# Patient Record
Sex: Male | Born: 1950 | Race: White | Hispanic: No | Marital: Married | Smoking: Never smoker
Health system: Southern US, Community
[De-identification: ages and names within clinical notes are randomized; demographics above are authoritative.]

## PROBLEM LIST (undated history)

## (undated) DIAGNOSIS — I10 Essential (primary) hypertension: Secondary | ICD-10-CM

## (undated) DIAGNOSIS — I219 Acute myocardial infarction, unspecified: Secondary | ICD-10-CM

## (undated) DIAGNOSIS — Z87442 Personal history of urinary calculi: Secondary | ICD-10-CM

## (undated) DIAGNOSIS — I2510 Atherosclerotic heart disease of native coronary artery without angina pectoris: Secondary | ICD-10-CM

## (undated) DIAGNOSIS — E785 Hyperlipidemia, unspecified: Secondary | ICD-10-CM

## (undated) HISTORY — PX: CARDIAC CATHETERIZATION: SHX172

## (undated) HISTORY — DX: Acute myocardial infarction, unspecified: I21.9

## (undated) HISTORY — DX: Essential (primary) hypertension: I10

## (undated) HISTORY — DX: Hyperlipidemia, unspecified: E78.5

## (undated) HISTORY — DX: Atherosclerotic heart disease of native coronary artery without angina pectoris: I25.10

---

## 2001-11-15 ENCOUNTER — Inpatient Hospital Stay (HOSPITAL_COMMUNITY)
Admission: EM | Admit: 2001-11-15 | Discharge: 2001-11-20 | Payer: Self-pay | Admitting: Thoracic Surgery (Cardiothoracic Vascular Surgery)

## 2001-11-16 ENCOUNTER — Encounter: Payer: Self-pay | Admitting: Thoracic Surgery (Cardiothoracic Vascular Surgery)

## 2001-11-17 ENCOUNTER — Encounter: Payer: Self-pay | Admitting: Thoracic Surgery (Cardiothoracic Vascular Surgery)

## 2001-11-18 ENCOUNTER — Encounter: Payer: Self-pay | Admitting: Thoracic Surgery (Cardiothoracic Vascular Surgery)

## 2002-07-21 HISTORY — PX: CORONARY ARTERY BYPASS GRAFT: SHX141

## 2005-01-08 ENCOUNTER — Ambulatory Visit: Payer: Self-pay | Admitting: Cardiology

## 2010-02-13 ENCOUNTER — Ambulatory Visit: Payer: Self-pay | Admitting: Internal Medicine

## 2010-04-04 ENCOUNTER — Ambulatory Visit: Payer: Self-pay | Admitting: Gastroenterology

## 2010-04-08 LAB — PATHOLOGY REPORT

## 2014-04-17 DIAGNOSIS — R3129 Other microscopic hematuria: Secondary | ICD-10-CM | POA: Insufficient documentation

## 2014-04-17 DIAGNOSIS — I10 Essential (primary) hypertension: Secondary | ICD-10-CM | POA: Insufficient documentation

## 2015-04-19 ENCOUNTER — Other Ambulatory Visit: Payer: Self-pay | Admitting: Internal Medicine

## 2015-04-19 DIAGNOSIS — I6529 Occlusion and stenosis of unspecified carotid artery: Secondary | ICD-10-CM

## 2015-04-23 ENCOUNTER — Ambulatory Visit: Payer: Self-pay

## 2015-04-24 ENCOUNTER — Ambulatory Visit: Payer: Self-pay

## 2015-10-17 DIAGNOSIS — I739 Peripheral vascular disease, unspecified: Secondary | ICD-10-CM | POA: Insufficient documentation

## 2017-01-19 DIAGNOSIS — R05 Cough: Secondary | ICD-10-CM | POA: Insufficient documentation

## 2017-01-19 DIAGNOSIS — R053 Chronic cough: Secondary | ICD-10-CM | POA: Insufficient documentation

## 2017-12-15 ENCOUNTER — Other Ambulatory Visit: Payer: Self-pay | Admitting: Internal Medicine

## 2017-12-15 DIAGNOSIS — R3129 Other microscopic hematuria: Secondary | ICD-10-CM

## 2017-12-15 DIAGNOSIS — R7989 Other specified abnormal findings of blood chemistry: Secondary | ICD-10-CM

## 2017-12-15 DIAGNOSIS — I739 Peripheral vascular disease, unspecified: Secondary | ICD-10-CM

## 2017-12-22 ENCOUNTER — Ambulatory Visit
Admission: RE | Admit: 2017-12-22 | Discharge: 2017-12-22 | Disposition: A | Discharge status: Home / Self Care | Payer: BC Managed Care – PPO | Source: Ambulatory Visit | Attending: Internal Medicine | Admitting: Internal Medicine

## 2017-12-22 DIAGNOSIS — R3129 Other microscopic hematuria: Secondary | ICD-10-CM | POA: Insufficient documentation

## 2017-12-22 DIAGNOSIS — R7989 Other specified abnormal findings of blood chemistry: Secondary | ICD-10-CM | POA: Diagnosis not present

## 2017-12-22 DIAGNOSIS — N1330 Unspecified hydronephrosis: Secondary | ICD-10-CM | POA: Diagnosis not present

## 2017-12-22 DIAGNOSIS — I739 Peripheral vascular disease, unspecified: Secondary | ICD-10-CM

## 2017-12-22 DIAGNOSIS — I6523 Occlusion and stenosis of bilateral carotid arteries: Secondary | ICD-10-CM | POA: Insufficient documentation

## 2017-12-22 DIAGNOSIS — N134 Hydroureter: Secondary | ICD-10-CM | POA: Diagnosis not present

## 2017-12-22 DIAGNOSIS — N200 Calculus of kidney: Secondary | ICD-10-CM | POA: Diagnosis not present

## 2017-12-24 DIAGNOSIS — N1330 Unspecified hydronephrosis: Secondary | ICD-10-CM | POA: Insufficient documentation

## 2018-01-11 DIAGNOSIS — E785 Hyperlipidemia, unspecified: Secondary | ICD-10-CM | POA: Insufficient documentation

## 2018-01-11 DIAGNOSIS — I2510 Atherosclerotic heart disease of native coronary artery without angina pectoris: Secondary | ICD-10-CM | POA: Insufficient documentation

## 2018-01-11 NOTE — Progress Notes (Signed)
01/12/2018 4:17 PM   Robert Fields [birth year] [id number]  Referring provider: Lynnea Ferrier, MD [address] [facility] [place], [place] [address]  Chief Complaint  Patient presents with  . Hydronephrosis    New patient    HPI: Patient is a 67 -year-old Caucasian male who presents today as a referral from Dr. Daniel Nones for hydronephrosis and microscopic hematuria.    Patient was found to have microscopic hematuria on several occassions over the last two years.  RUS in 12/2017 noted right hydronephrosis.    He does not have a prior history of recurrent urinary tract infections, nephrolithiasis, trauma to the genitourinary tract, BPH or malignancies of the genitourinary tract.   She does not have a family medical history of nephrolithiasis, malignancies of the genitourinary tract or hematuria.   Today, he are having nocturia x 1.  His UA today is negative.  Patient denies any gross hematuria, dysuria or suprapubic/flank pain.  Patient denies any fevers, chills, nausea or vomiting.    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  He has a high BMI.     PMH: Past Medical History:  Diagnosis Date  . CAD (coronary artery disease)   . Hyperlipemia   . Hypertension   . MI (myocardial infarction) [facility])     Surgical History: Cardiac bypass 2004  Home Medications:  Allergies as of 01/12/2018      Reactions   Doxycycline Hyclate Rash      Medication List        Accurate as of 01/12/18  4:17 PM. Always use your most recent med list.          aspirin EC 81 MG tablet Take by mouth.   ezetimibe 10 MG tablet Commonly known as:  ZETIA TAKE 1 TABLET BY MOUTH ONCE A DAY   losartan 50 MG tablet Commonly known as:  COZAAR   MULTI-VITAMINS Tabs Take by mouth.   rosuvastatin 40 MG tablet Commonly known as:  CRESTOR Take 40 mg by mouth daily.       Allergies:  Allergies  Allergen Reactions  . Doxycycline Hyclate Rash     Family History: No family history on file.  Social History:  reports that he has never smoked. He has never used smokeless tobacco. He reports that he drinks alcohol. He reports that he does not use drugs.  ROS: UROLOGY Frequent Urination?: No Hard to postpone urination?: No Burning/pain with urination?: No Get up at night to urinate?: Yes Leakage of urine?: No Urine stream starts and stops?: No Trouble starting stream?: No Do you have to strain to urinate?: No Blood in urine?: No Urinary tract infection?: No Sexually transmitted disease?: No Injury to kidneys or bladder?: No Painful intercourse?: No Weak stream?: No Erection problems?: Yes Penile pain?: No  Gastrointestinal Nausea?: No Vomiting?: No Indigestion/heartburn?: Yes Diarrhea?: No Constipation?: No  Constitutional Fever: No Night sweats?: No Weight loss?: No Fatigue?: No  Skin Skin rash/lesions?: No Itching?: Yes  Eyes Blurred vision?: No  Ears/Nose/Throat Sore throat?: No Sinus problems?: No  Hematologic/Lymphatic Swollen glands?: No Easy bruising?: No  Cardiovascular Leg swelling?: No Chest pain?: No  Respiratory Cough?: No Shortness of breath?: No  Endocrine Excessive thirst?: No  Musculoskeletal Back pain?: No Joint pain?: Yes  Neurological Headaches?: No Dizziness?: No  Psychologic Depression?: No Anxiety?: No  Physical Exam: BP (!) 148/81   Pulse 67   Ht 6\' 1"  (  1.854 m)   Wt 235 lb (106.6 kg)   BMI 31.00 kg/m   Constitutional:  Well nourished. Alert and oriented, No acute distress. HEENT: [place] AT, moist mucus membranes.  Trachea midline, no masses. Cardiovascular: No clubbing, cyanosis, or edema. Respiratory: Normal respiratory effort, no increased work of breathing. GI: Abdomen is soft, non tender, non distended, no abdominal masses. Liver and spleen not palpable.  No hernias appreciated.  Stool sample for occult testing is not indicated.   GU: No CVA  tenderness.  No bladder fullness or masses.  Patient with circumcised phallus.  Urethral meatus is patent.  No penile discharge. No penile lesions or rashes. Scrotum without lesions, cysts, rashes and/or edema.  Testicles are located scrotally bilaterally. No masses are appreciated in the testicles. Left and right epididymis are normal. Rectal: Patient with  normal sphincter tone. Anus and perineum without scarring or rashes. No rectal masses are appreciated. Prostate is approximately 35 grams, no nodules are appreciated. Seminal vesicles are normal. Skin: No rashes, bruises or suspicious lesions. Lymph: No cervical or inguinal adenopathy. Neurologic: Grossly intact, no focal deficits, moving all 4 extremities. Psychiatric: Normal mood and affect.  Laboratory Data: PSA history   0.49 in 05/2017 No results found for: WBC, HGB, HCT, MCV, PLT  No results found for: CREATININE  No results found for: PSA  No results found for: TESTOSTERONE  No results found for: HGBA1C  No results found for: TSH  No results found for: CHOL, HDL, CHOLHDL, VLDL, LDLCALC  No results found for: AST No results found for: ALT No components found for: ALKALINEPHOPHATASE No components found for: BILIRUBINTOTAL  No results found for: ESTRADIOL   Urinalysis See HPI and Epic  Pertinent Imaging: CLINICAL DATA:  Microscopic hematuria.  Elevated serum creatinine.  EXAM: RENAL / URINARY TRACT ULTRASOUND COMPLETE  COMPARISON:  None.  FINDINGS: Right Kidney:  Length: 14.4 cm. There is severe hydronephrosis. No obstructive masses or calculi is seen. The proximal right ureter is dilated as well measuring up to 1.3 cm in cross-section.  Left Kidney:  Length: 12.7 cm. Echogenicity within normal limits. No mass or hydronephrosis visualized. Several less than 5 mm shadowing calculi are seen in the mid polar region and lower pole of the left kidney.  Bladder:  Appears normal for degree of bladder  distention. Prevoid volume of 190 cc and postvoid volume of 30 cc. Right ureteral jet is not seen.  IMPRESSION: Severe right hydronephrosis and right hydroureter with nonvisualization of the right ureteral jet, consistent with obstructive uropathy. The site of obstruction is not seen.  Left nonobstructive nephrolithiasis.  Normal appearance of the urinary bladder.  These results will be called to the ordering clinician or representative by the Radiologist Assistant, and communication documented in the PACS or zVision Dashboard.   Electronically Signed   By: Ted Mcalpine M.D.   On: 12/22/2017 21:49 I have independently reviewed the films  Assessment & Plan:    1. Microscopic hematuria Explained to the patient that there are a number of causes that can be associated with blood in the urine, such as stones, BPH, UTI's, damage to the urinary tract and/or cancer. At this time, I felt that the patient warranted further urologic evaluation.   The AUA guidelines state that a CT urogram is the preferred imaging study to evaluate hematuria. I explained to the patient that a contrast material will be injected into a vein and that in rare instances, an allergic reaction can result and may even life  threatening   The patient denies any allergies to contrast, iodine and/or seafood and is not taking metformin. Following the imaging study,  I've recommended a cystoscopy. I described how this is performed, typically in an office setting with a flexible cystoscope. We described the risks, benefits, and possible side effects, the most common of which is a minor amount of blood in the urine and/or burning which usually resolves in 24 to 48 hours.   The patient had the opportunity to ask questions which were answered. Based upon this discussion, the patient is willing to proceed. Therefore, I've ordered: a CT Urogram and cystoscopy.  The patient will return following all of the above for  discussion of the results.  UA negative Urine culture pending BUN + creatinine    2. Right hydronephrosis CTU pending  3. Left renal stone CTU pending   Return for CT Urogram report and cystoscopy.  These notes generated with voice recognition software. I apologize for typographical errors.  Michiel CowboySHANNON Zamauri Nez, PA-C  [facility] [address]  [place], [place] [address] [phone]

## 2018-01-11 NOTE — H&P (View-Only) (Signed)
01/12/2018 4:17 PM   Robert Fields 1950-11-21 [id number]  Referring provider: Lynnea Ferrier, MD [address] [facility] [place], [place] [address]  Chief Complaint  Patient presents with  . Hydronephrosis    New patient    HPI: Patient is a 67 -year-old Caucasian male who presents today as a referral from Dr. Daniel Nones for hydronephrosis and microscopic hematuria.    Patient was found to have microscopic hematuria on several occassions over the last two years.  RUS in 12/2017 noted right hydronephrosis.    He does not have a prior history of recurrent urinary tract infections, nephrolithiasis, trauma to the genitourinary tract, BPH or malignancies of the genitourinary tract.   She does not have a family medical history of nephrolithiasis, malignancies of the genitourinary tract or hematuria.   Today, he are having nocturia x 1.  His UA today is negative.  Patient denies any gross hematuria, dysuria or suprapubic/flank pain.  Patient denies any fevers, chills, nausea or vomiting.    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  He has a high BMI.     PMH: Past Medical History:  Diagnosis Date  . CAD (coronary artery disease)   . Hyperlipemia   . Hypertension   . MI (myocardial infarction) [facility])     Surgical History: Cardiac bypass 2004  Home Medications:  Allergies as of 01/12/2018      Reactions   Doxycycline Hyclate Rash      Medication List        Accurate as of 01/12/18  4:17 PM. Always use your most recent med list.          aspirin EC 81 MG tablet Take by mouth.   ezetimibe 10 MG tablet Commonly known as:  ZETIA TAKE 1 TABLET BY MOUTH ONCE A DAY   losartan 50 MG tablet Commonly known as:  COZAAR   MULTI-VITAMINS Tabs Take by mouth.   rosuvastatin 40 MG tablet Commonly known as:  CRESTOR Take 40 mg by mouth daily.       Allergies:  Allergies  Allergen Reactions  . Doxycycline Hyclate Rash     Family History: No family history on file.  Social History:  reports that he has never smoked. He has never used smokeless tobacco. He reports that he drinks alcohol. He reports that he does not use drugs.  ROS: UROLOGY Frequent Urination?: No Hard to postpone urination?: No Burning/pain with urination?: No Get up at night to urinate?: Yes Leakage of urine?: No Urine stream starts and stops?: No Trouble starting stream?: No Do you have to strain to urinate?: No Blood in urine?: No Urinary tract infection?: No Sexually transmitted disease?: No Injury to kidneys or bladder?: No Painful intercourse?: No Weak stream?: No Erection problems?: Yes Penile pain?: No  Gastrointestinal Nausea?: No Vomiting?: No Indigestion/heartburn?: Yes Diarrhea?: No Constipation?: No  Constitutional Fever: No Night sweats?: No Weight loss?: No Fatigue?: No  Skin Skin rash/lesions?: No Itching?: Yes  Eyes Blurred vision?: No  Ears/Nose/Throat Sore throat?: No Sinus problems?: No  Hematologic/Lymphatic Swollen glands?: No Easy bruising?: No  Cardiovascular Leg swelling?: No Chest pain?: No  Respiratory Cough?: No Shortness of breath?: No  Endocrine Excessive thirst?: No  Musculoskeletal Back pain?: No Joint pain?: Yes  Neurological Headaches?: No Dizziness?: No  Psychologic Depression?: No Anxiety?: No  Physical Exam: BP (!) 148/81   Pulse 67   Ht 6\' 1"  (  1.854 m)   Wt 235 lb (106.6 kg)   BMI 31.00 kg/m   Constitutional:  Well nourished. Alert and oriented, No acute distress. HEENT: [place] AT, moist mucus membranes.  Trachea midline, no masses. Cardiovascular: No clubbing, cyanosis, or edema. Respiratory: Normal respiratory effort, no increased work of breathing. GI: Abdomen is soft, non tender, non distended, no abdominal masses. Liver and spleen not palpable.  No hernias appreciated.  Stool sample for occult testing is not indicated.   GU: No CVA  tenderness.  No bladder fullness or masses.  Patient with circumcised phallus.  Urethral meatus is patent.  No penile discharge. No penile lesions or rashes. Scrotum without lesions, cysts, rashes and/or edema.  Testicles are located scrotally bilaterally. No masses are appreciated in the testicles. Left and right epididymis are normal. Rectal: Patient with  normal sphincter tone. Anus and perineum without scarring or rashes. No rectal masses are appreciated. Prostate is approximately 35 grams, no nodules are appreciated. Seminal vesicles are normal. Skin: No rashes, bruises or suspicious lesions. Lymph: No cervical or inguinal adenopathy. Neurologic: Grossly intact, no focal deficits, moving all 4 extremities. Psychiatric: Normal mood and affect.  Laboratory Data: PSA history   0.49 in 05/2017 No results found for: WBC, HGB, HCT, MCV, PLT  No results found for: CREATININE  No results found for: PSA  No results found for: TESTOSTERONE  No results found for: HGBA1C  No results found for: TSH  No results found for: CHOL, HDL, CHOLHDL, VLDL, LDLCALC  No results found for: AST No results found for: ALT No components found for: ALKALINEPHOPHATASE No components found for: BILIRUBINTOTAL  No results found for: ESTRADIOL   Urinalysis See HPI and Epic  Pertinent Imaging: CLINICAL DATA:  Microscopic hematuria.  Elevated serum creatinine.  EXAM: RENAL / URINARY TRACT ULTRASOUND COMPLETE  COMPARISON:  None.  FINDINGS: Right Kidney:  Length: 14.4 cm. There is severe hydronephrosis. No obstructive masses or calculi is seen. The proximal right ureter is dilated as well measuring up to 1.3 cm in cross-section.  Left Kidney:  Length: 12.7 cm. Echogenicity within normal limits. No mass or hydronephrosis visualized. Several less than 5 mm shadowing calculi are seen in the mid polar region and lower pole of the left kidney.  Bladder:  Appears normal for degree of bladder  distention. Prevoid volume of 190 cc and postvoid volume of 30 cc. Right ureteral jet is not seen.  IMPRESSION: Severe right hydronephrosis and right hydroureter with nonvisualization of the right ureteral jet, consistent with obstructive uropathy. The site of obstruction is not seen.  Left nonobstructive nephrolithiasis.  Normal appearance of the urinary bladder.  These results will be called to the ordering clinician or representative by the Radiologist Assistant, and communication documented in the PACS or zVision Dashboard.   Electronically Signed   By: Ted Mcalpine M.D.   On: 12/22/2017 21:49 I have independently reviewed the films  Assessment & Plan:    1. Microscopic hematuria Explained to the patient that there are a number of causes that can be associated with blood in the urine, such as stones, BPH, UTI's, damage to the urinary tract and/or cancer. At this time, I felt that the patient warranted further urologic evaluation.   The AUA guidelines state that a CT urogram is the preferred imaging study to evaluate hematuria. I explained to the patient that a contrast material will be injected into a vein and that in rare instances, an allergic reaction can result and may even life  threatening   The patient denies any allergies to contrast, iodine and/or seafood and is not taking metformin. Following the imaging study,  I've recommended a cystoscopy. I described how this is performed, typically in an office setting with a flexible cystoscope. We described the risks, benefits, and possible side effects, the most common of which is a minor amount of blood in the urine and/or burning which usually resolves in 24 to 48 hours.   The patient had the opportunity to ask questions which were answered. Based upon this discussion, the patient is willing to proceed. Therefore, I've ordered: a CT Urogram and cystoscopy.  The patient will return following all of the above for  discussion of the results.  UA negative Urine culture pending BUN + creatinine    2. Right hydronephrosis CTU pending  3. Left renal stone CTU pending   Return for CT Urogram report and cystoscopy.  These notes generated with voice recognition software. I apologize for typographical errors.  Michiel CowboySHANNON Amillia Biffle, PA-C  [facility] [address]  [place], [place] [address] [phone]

## 2018-01-12 ENCOUNTER — Encounter: Payer: Self-pay | Admitting: Urology

## 2018-01-12 ENCOUNTER — Ambulatory Visit: Payer: BC Managed Care – PPO | Admitting: Urology

## 2018-01-12 VITALS — BP 148/81 | HR 67 | Ht 73.0 in | Wt 235.0 lb

## 2018-01-12 DIAGNOSIS — N200 Calculus of kidney: Secondary | ICD-10-CM | POA: Diagnosis not present

## 2018-01-12 DIAGNOSIS — R3129 Other microscopic hematuria: Secondary | ICD-10-CM | POA: Diagnosis not present

## 2018-01-12 DIAGNOSIS — N1339 Other hydronephrosis: Secondary | ICD-10-CM | POA: Diagnosis not present

## 2018-01-12 NOTE — Patient Instructions (Signed)
Hydronephrosis Hydronephrosis is the enlargement of a kidney due to a blockage that stops urine from flowing out of the body. What are the causes? Common causes of this condition include:  A birth (congenital) defect of the kidney.  A congenital defect of the tube through which urine travels (ureter).  Kidney stones.  An enlarged prostate gland.  A tumor.  Cancer of the prostate, bladder, uterus, ovary, or colon.  A blood clot.  What are the signs or symptoms? Symptoms of this condition include:  Pain or discomfort in your side (flank).  Swelling of the abdomen.  Pain in the abdomen.  Nausea and vomiting.  Fever.  Pain while passing urine.  Feeling of urgency to urinate.  Frequent urination.  Infection of the urinary tract.  In some cases, there are no symptoms. How is this diagnosed? This condition may be diagnosed with:  A medical history.  A physical exam.  Blood and urine tests to check kidney function.  Imaging tests, such as an X-ray, ultrasound, CT scan, or MRI.  A test in which a rigid or flexible telescope (cystoscope) is used to view the site of the blockage.  How is this treated? Treatment for this condition depends on where the blockage is located, how long it has been there, and what caused it. The goal of treatment is to remove the blockage. Treatment options include:  A procedure to put in a soft tube to help drain urine.  Antibiotic medicines to treat or prevent infection.  Shock-wave therapy (lithotripsy) to help eliminate kidney stones.  Follow these instructions at home:  Get lots of rest.  Drink enough fluid to keep your urine clear or pale yellow.  If you have a drain in, follow your health care provider's instructions about how to care for it.  Take medicines only as directed by your health care provider.  If you were prescribed an antibiotic medicine, finish all of it even if you start to feel better.  Keep all  follow-up visits as directed by your health care provider. This is important. Contact a health care provider if:  You continue to have symptoms after treatment.  You develop new symptoms.  You have a problem with a drainage device.  Your urine becomes cloudy or bloody.  You have a fever. Get help right away if:  You have severe flank or abdominal pain.  You develop vomiting and are unable to keep fluids down. This information is not intended to replace advice given to you by your health care provider. Make sure you discuss any questions you have with your health care provider. Document Released: 05/04/2007 Document Revised: 12/13/2015 Document Reviewed: 07/03/2014 Elsevier Interactive Patient Education  2018 Elsevier Inc.  

## 2018-01-13 LAB — URINALYSIS, COMPLETE
BILIRUBIN UA: NEGATIVE
Glucose, UA: NEGATIVE
Ketones, UA: NEGATIVE
Nitrite, UA: NEGATIVE
PH UA: 6 (ref 5.0–7.5)
Protein, UA: NEGATIVE
RBC UA: NEGATIVE
Specific Gravity, UA: 1.015 (ref 1.005–1.030)
Urobilinogen, Ur: 1 mg/dL (ref 0.2–1.0)

## 2018-01-13 LAB — BUN+CREAT
BUN / CREAT RATIO: 15 (ref 10–24)
BUN: 22 mg/dL (ref 8–27)
Creatinine, Ser: 1.43 mg/dL — ABNORMAL HIGH (ref 0.76–1.27)
GFR calc Af Amer: 59 mL/min/{1.73_m2} — ABNORMAL LOW (ref >59)
GFR calc non Af Amer: 51 mL/min/{1.73_m2} — ABNORMAL LOW (ref >59)

## 2018-01-13 LAB — MICROSCOPIC EXAMINATION: EPITHELIAL CELLS (NON RENAL): NONE SEEN /HPF (ref 0–10)

## 2018-01-15 LAB — CULTURE, URINE COMPREHENSIVE

## 2018-01-22 ENCOUNTER — Ambulatory Visit
Admission: RE | Admit: 2018-01-22 | Discharge: 2018-01-22 | Disposition: A | Discharge status: Home / Self Care | Payer: BC Managed Care – PPO | Source: Ambulatory Visit | Attending: Urology | Admitting: Urology

## 2018-01-22 DIAGNOSIS — N261 Atrophy of kidney (terminal): Secondary | ICD-10-CM | POA: Insufficient documentation

## 2018-01-22 DIAGNOSIS — N132 Hydronephrosis with renal and ureteral calculous obstruction: Secondary | ICD-10-CM | POA: Diagnosis not present

## 2018-01-22 DIAGNOSIS — I2510 Atherosclerotic heart disease of native coronary artery without angina pectoris: Secondary | ICD-10-CM | POA: Insufficient documentation

## 2018-01-22 DIAGNOSIS — R3129 Other microscopic hematuria: Secondary | ICD-10-CM | POA: Diagnosis present

## 2018-01-22 DIAGNOSIS — N1339 Other hydronephrosis: Secondary | ICD-10-CM | POA: Insufficient documentation

## 2018-01-22 DIAGNOSIS — I700 Atherosclerosis of aorta: Secondary | ICD-10-CM | POA: Diagnosis not present

## 2018-01-22 MED ORDER — IOPAMIDOL (ISOVUE-300) INJECTION 61%
125.0000 mL | Freq: Once | INTRAVENOUS | Status: AC | PRN
  Administered 2018-01-22: 125 mL via INTRAVENOUS

## 2018-01-25 ENCOUNTER — Other Ambulatory Visit: Payer: Self-pay | Admitting: Radiology

## 2018-01-26 ENCOUNTER — Telehealth: Payer: Self-pay | Admitting: Radiology

## 2018-01-26 NOTE — Telephone Encounter (Signed)
-----   Message from Harle BattiestShannon A McGowan, PA-C sent at 01/22/2018 10:47 AM EDT ----- I spoke with Mr. Montez MoritaCarter and explained the CT results to him.  We need to schedule him for right URS/LL/ureteral stent placement.  He will need cardiac clearance with Dr. Lady GaryFath prior to surgery.

## 2018-01-26 NOTE — Telephone Encounter (Signed)
Surgery scheduled with Dr Apolinar JunesBrandon on 02/08/2018. Patient aware. Questions answered. Patient voices understanding.

## 2018-01-27 ENCOUNTER — Other Ambulatory Visit: Payer: Self-pay | Admitting: Radiology

## 2018-02-01 ENCOUNTER — Other Ambulatory Visit: Payer: Self-pay | Admitting: Radiology

## 2018-02-01 DIAGNOSIS — N200 Calculus of kidney: Secondary | ICD-10-CM

## 2018-02-01 NOTE — Telephone Encounter (Signed)
-----   Message from Vanna ScotlandAshley Brandon, MD sent at 01/31/2018  4:05 PM EDT ----- No special orders.  Ancef 2 g perioperatively.  Vanna ScotlandAshley Brandon, MD  ----- Message ----- From: Nicolasa DuckingHeidel, Sherie Dobrowolski Leigh, RN Sent: 01/27/2018  12:09 PM To: Vanna ScotlandAshley Brandon, MD  FYI about surgery scheduled by Carollee HerterShannon. Also need orders for antibiotic for surgery. Other orders entered as usual. Do I need to add anything other than the ucx at pre-admit?

## 2018-02-02 ENCOUNTER — Encounter
Admission: RE | Admit: 2018-02-02 | Discharge: 2018-02-02 | Disposition: A | Discharge status: Home / Self Care | Payer: BC Managed Care – PPO | Source: Ambulatory Visit | Attending: Urology | Admitting: Urology

## 2018-02-02 ENCOUNTER — Other Ambulatory Visit: Payer: Self-pay

## 2018-02-02 DIAGNOSIS — Z01812 Encounter for preprocedural laboratory examination: Secondary | ICD-10-CM | POA: Insufficient documentation

## 2018-02-02 HISTORY — DX: Personal history of urinary calculi: Z87.442

## 2018-02-02 LAB — URINALYSIS, ROUTINE W REFLEX MICROSCOPIC
BACTERIA UA: NONE SEEN
Bilirubin Urine: NEGATIVE
Glucose, UA: NEGATIVE mg/dL
HGB URINE DIPSTICK: NEGATIVE
Ketones, ur: 5 mg/dL — AB
Nitrite: NEGATIVE
PROTEIN: NEGATIVE mg/dL
SPECIFIC GRAVITY, URINE: 1.013 (ref 1.005–1.030)
pH: 7 (ref 5.0–8.0)

## 2018-02-02 LAB — CBC
HEMATOCRIT: 44.8 % (ref 40.0–52.0)
Hemoglobin: 15.3 g/dL (ref 13.0–18.0)
MCH: 31 pg (ref 26.0–34.0)
MCHC: 34.2 g/dL (ref 32.0–36.0)
MCV: 90.8 fL (ref 80.0–100.0)
PLATELETS: 212 10*3/uL (ref 150–440)
RBC: 4.93 MIL/uL (ref 4.40–5.90)
RDW: 13.1 % (ref 11.5–14.5)
WBC: 4.7 10*3/uL (ref 3.8–10.6)

## 2018-02-02 LAB — BASIC METABOLIC PANEL
Anion gap: 10 (ref 5–15)
BUN: 20 mg/dL (ref 8–23)
CHLORIDE: 102 mmol/L (ref 98–111)
CO2: 27 mmol/L (ref 22–32)
CREATININE: 1.18 mg/dL (ref 0.61–1.24)
Calcium: 9.5 mg/dL (ref 8.9–10.3)
GFR calc Af Amer: >60 mL/min (ref >60)
GFR calc non Af Amer: >60 mL/min (ref >60)
GLUCOSE: 102 mg/dL — AB (ref 70–99)
POTASSIUM: 4.7 mmol/L (ref 3.5–5.1)
SODIUM: 139 mmol/L (ref 135–145)

## 2018-02-02 NOTE — Patient Instructions (Signed)
Your procedure is scheduled on: Monday, February 08, 2018 Report [facility] To find out your arrival time, please call [phone] between 1PM - 3PM on: Friday, February 05, 2018  REMEMBER: Instructions that are not followed completely may result in serious medical risk, up to and including death; or upon the discretion of your surgeon and anesthesiologist your surgery may need to be rescheduled.  Do not eat food after midnight the night before your procedure.  No gum chewing, lozengers or hard candies.  You may however, drink CLEAR liquids up to 2 hours before you are scheduled to arrive for your surgery. Do not drink anything within 2 hours of the start of your surgery.  Clear liquids include: - water  - apple juice without pulp - clear gatorade - black coffee or tea (Do NOT add anything to the coffee or tea) Do NOT drink anything that is not on this list.  No Alcohol for 24 hours before or after surgery.  No Smoking including e-cigarettes for 24 hours prior to surgery.  No chewable tobacco products for at least 6 hours prior to surgery.  No nicotine patches on the day of surgery.  On the morning of surgery brush your teeth with toothpaste and water, you may rinse your mouth with mouthwash if you wish. Do not swallow any toothpaste or mouthwash.  Notify your doctor if there is any change in your medical condition (cold, fever, infection).  Do not wear jewelry, make-up, hairpins, clips or nail polish.  Do not wear lotions, powders, or perfumes. You may wear deodorant.  Do not shave 48 hours prior to surgery. Men may shave face and neck.  Contacts and dentures may not be worn into surgery.  Do not bring valuables to the hospital, including drivers license, insurance or credit cards.  [facility] is not responsible for any belongings or valuables.   TAKE THESE MEDICATIONS THE MORNING OF SURGERY:  none  Follow recommendations from  Cardiologist regarding stopping Aspirin. STOP NOW!  NOW!  Stop Anti-inflammatories (NSAIDS) such as Advil, Aleve, Ibuprofen, Motrin, Naproxen, Naprosyn and Aspirin based products such as Excedrin, Goodys Powder, BC Powder. (May take Tylenol or Acetaminophen if needed.)  NOW!  Stop ANY OVER THE COUNTER supplements until after surgery. (May continue multivitamin.)  Wear comfortable clothing (specific to your surgery type) to the hospital.  Plan for stool softeners for home use.  If you are being discharged the day of surgery, you will not be allowed to drive home. You will need a responsible adult to drive you home and stay with you that night.   If you are taking public transportation, you will need to have a responsible adult with you. Please confirm with your physician that it is acceptable to use public transportation.   Please call [phone] if you have any questions about these instructions.

## 2018-02-03 LAB — URINE CULTURE: Culture: NO GROWTH

## 2018-02-03 NOTE — Pre-Procedure Instructions (Signed)
UA results sent to Dr. Brandon for review.  

## 2018-02-05 ENCOUNTER — Ambulatory Visit: Payer: BC Managed Care – PPO | Admitting: Urology

## 2018-02-05 ENCOUNTER — Encounter: Payer: Self-pay | Admitting: Urology

## 2018-02-05 ENCOUNTER — Other Ambulatory Visit: Payer: Self-pay | Admitting: Radiology

## 2018-02-05 DIAGNOSIS — N200 Calculus of kidney: Secondary | ICD-10-CM

## 2018-02-05 LAB — MICROSCOPIC EXAMINATION

## 2018-02-05 LAB — URINALYSIS, COMPLETE
Bilirubin, UA: NEGATIVE
Glucose, UA: NEGATIVE
Ketones, UA: NEGATIVE
Nitrite, UA: NEGATIVE
PH UA: 7 (ref 5.0–7.5)
Specific Gravity, UA: 1.015 (ref 1.005–1.030)
Urobilinogen, Ur: 1 mg/dL (ref 0.2–1.0)

## 2018-02-05 MED ORDER — LIDOCAINE HCL URETHRAL/MUCOSAL 2 % EX GEL: 1.0000 "application " | Freq: Once | CUTANEOUS | Status: DC

## 2018-02-05 MED ORDER — CIPROFLOXACIN HCL 500 MG PO TABS: 500.0000 mg | ORAL_TABLET | Freq: Once | ORAL | Status: DC

## 2018-02-08 ENCOUNTER — Other Ambulatory Visit: Payer: Self-pay | Admitting: Radiology

## 2018-02-08 ENCOUNTER — Telehealth: Payer: Self-pay | Admitting: Radiology

## 2018-02-08 ENCOUNTER — Other Ambulatory Visit: Payer: Self-pay

## 2018-02-08 ENCOUNTER — Ambulatory Visit: Payer: BC Managed Care – PPO | Admitting: Anesthesiology

## 2018-02-08 ENCOUNTER — Ambulatory Visit
Admission: RE | Admit: 2018-02-08 | Discharge: 2018-02-08 | Disposition: A | Discharge status: Home / Self Care | Payer: BC Managed Care – PPO | Source: Ambulatory Visit | Attending: Urology | Admitting: Urology

## 2018-02-08 ENCOUNTER — Encounter: Payer: Self-pay | Admitting: Anesthesiology

## 2018-02-08 ENCOUNTER — Encounter
Admission: RE | Disposition: A | Discharge status: Home / Self Care | Payer: Self-pay | Source: Ambulatory Visit | Attending: Urology

## 2018-02-08 DIAGNOSIS — E785 Hyperlipidemia, unspecified: Secondary | ICD-10-CM | POA: Diagnosis not present

## 2018-02-08 DIAGNOSIS — I10 Essential (primary) hypertension: Secondary | ICD-10-CM | POA: Diagnosis not present

## 2018-02-08 DIAGNOSIS — I739 Peripheral vascular disease, unspecified: Secondary | ICD-10-CM | POA: Diagnosis not present

## 2018-02-08 DIAGNOSIS — Z6830 Body mass index (BMI) 30.0-30.9, adult: Secondary | ICD-10-CM | POA: Diagnosis not present

## 2018-02-08 DIAGNOSIS — Z79899 Other long term (current) drug therapy: Secondary | ICD-10-CM | POA: Insufficient documentation

## 2018-02-08 DIAGNOSIS — N201 Calculus of ureter: Secondary | ICD-10-CM

## 2018-02-08 DIAGNOSIS — Z7982 Long term (current) use of aspirin: Secondary | ICD-10-CM | POA: Diagnosis not present

## 2018-02-08 DIAGNOSIS — Z881 Allergy status to other antibiotic agents status: Secondary | ICD-10-CM | POA: Diagnosis not present

## 2018-02-08 DIAGNOSIS — N135 Crossing vessel and stricture of ureter without hydronephrosis: Secondary | ICD-10-CM

## 2018-02-08 DIAGNOSIS — N132 Hydronephrosis with renal and ureteral calculous obstruction: Secondary | ICD-10-CM | POA: Insufficient documentation

## 2018-02-08 DIAGNOSIS — E669 Obesity, unspecified: Secondary | ICD-10-CM | POA: Insufficient documentation

## 2018-02-08 DIAGNOSIS — I252 Old myocardial infarction: Secondary | ICD-10-CM | POA: Diagnosis not present

## 2018-02-08 DIAGNOSIS — Z951 Presence of aortocoronary bypass graft: Secondary | ICD-10-CM | POA: Diagnosis not present

## 2018-02-08 DIAGNOSIS — I2510 Atherosclerotic heart disease of native coronary artery without angina pectoris: Secondary | ICD-10-CM | POA: Insufficient documentation

## 2018-02-08 DIAGNOSIS — N261 Atrophy of kidney (terminal): Secondary | ICD-10-CM | POA: Insufficient documentation

## 2018-02-08 DIAGNOSIS — N200 Calculus of kidney: Secondary | ICD-10-CM

## 2018-02-08 HISTORY — PX: CYSTOSCOPY/RETROGRADE/URETEROSCOPY: SHX5316

## 2018-02-08 SURGERY — CYSTOSCOPY/RETROGRADE/URETEROSCOPY
Anesthesia: General | Site: Ureter | Laterality: Left | Wound class: Clean Contaminated

## 2018-02-08 MED ORDER — SULFAMETHOXAZOLE-TRIMETHOPRIM 800-160 MG PO TABS: 1.0000 | ORAL_TABLET | Freq: Two times a day (BID) | ORAL | 0 refills | Status: DC

## 2018-02-08 MED ORDER — PROPOFOL 10 MG/ML IV BOLUS
INTRAVENOUS | Status: AC
  Filled 2018-02-08: qty 20

## 2018-02-08 MED ORDER — FENTANYL CITRATE (PF) 100 MCG/2ML IJ SOLN
25.0000 ug | INTRAMUSCULAR | Status: DC | PRN
Start: 2018-02-08 — End: 2018-02-08

## 2018-02-08 MED ORDER — CEFAZOLIN SODIUM-DEXTROSE 2-4 GM/100ML-% IV SOLN
INTRAVENOUS | Status: AC
  Filled 2018-02-08: qty 100

## 2018-02-08 MED ORDER — LACTATED RINGERS IV SOLN
INTRAVENOUS | Status: DC
  Administered 2018-02-08: 10:00:00 via INTRAVENOUS

## 2018-02-08 MED ORDER — LIDOCAINE HCL (CARDIAC) PF 100 MG/5ML IV SOSY
PREFILLED_SYRINGE | INTRAVENOUS | Status: DC | PRN
  Administered 2018-02-08: 100 mg via INTRAVENOUS

## 2018-02-08 MED ORDER — FENTANYL CITRATE (PF) 100 MCG/2ML IJ SOLN
INTRAMUSCULAR | Status: AC
  Filled 2018-02-08: qty 2

## 2018-02-08 MED ORDER — PROPOFOL 10 MG/ML IV BOLUS
INTRAVENOUS | Status: DC | PRN
  Administered 2018-02-08: 200 mg via INTRAVENOUS

## 2018-02-08 MED ORDER — FAMOTIDINE 20 MG PO TABS
ORAL_TABLET | ORAL | Status: AC
  Administered 2018-02-08: 20 mg via ORAL
  Filled 2018-02-08: qty 1

## 2018-02-08 MED ORDER — MIDAZOLAM HCL 2 MG/2ML IJ SOLN
INTRAMUSCULAR | Status: AC
  Filled 2018-02-08: qty 2

## 2018-02-08 MED ORDER — ONDANSETRON HCL 4 MG/2ML IJ SOLN: 4.0000 mg | Freq: Once | INTRAMUSCULAR | Status: DC | PRN

## 2018-02-08 MED ORDER — ONDANSETRON HCL 4 MG/2ML IJ SOLN
INTRAMUSCULAR | Status: DC | PRN
  Administered 2018-02-08: 4 mg via INTRAVENOUS

## 2018-02-08 MED ORDER — DEXAMETHASONE SODIUM PHOSPHATE 10 MG/ML IJ SOLN
INTRAMUSCULAR | Status: DC | PRN
  Administered 2018-02-08: 10 mg via INTRAVENOUS

## 2018-02-08 MED ORDER — CEFAZOLIN SODIUM-DEXTROSE 2-4 GM/100ML-% IV SOLN
2.0000 g | INTRAVENOUS | Status: AC
  Administered 2018-02-08: 2 g via INTRAVENOUS

## 2018-02-08 MED ORDER — FAMOTIDINE 20 MG PO TABS
20.0000 mg | ORAL_TABLET | Freq: Once | ORAL | Status: AC
  Administered 2018-02-08: 20 mg via ORAL

## 2018-02-08 MED ORDER — FENTANYL CITRATE (PF) 100 MCG/2ML IJ SOLN
INTRAMUSCULAR | Status: DC | PRN
  Administered 2018-02-08: 50 ug via INTRAVENOUS

## 2018-02-08 MED ORDER — IOTHALAMATE MEGLUMINE 43 % IV SOLN
INTRAVENOUS | Status: DC | PRN
  Administered 2018-02-08: 15 mL via URETHRAL

## 2018-02-08 MED ORDER — MIDAZOLAM HCL 2 MG/2ML IJ SOLN
INTRAMUSCULAR | Status: DC | PRN
  Administered 2018-02-08: 2 mg via INTRAVENOUS

## 2018-02-08 MED ORDER — EPHEDRINE SULFATE 50 MG/ML IJ SOLN
INTRAMUSCULAR | Status: DC | PRN
  Administered 2018-02-08: 10 mg via INTRAVENOUS
  Administered 2018-02-08: 5 mg via INTRAVENOUS

## 2018-02-08 MED ORDER — OXYCODONE-ACETAMINOPHEN 5-325 MG PO TABS: 1.0000 | ORAL_TABLET | ORAL | 0 refills | Status: DC | PRN

## 2018-02-08 SURGICAL SUPPLY — 33 items
BAG DRAIN CYSTO-URO LG1000N (MISCELLANEOUS) ×4 IMPLANT
BASKET ZERO TIP 1.9FR (BASKET) IMPLANT
BRUSH SCRUB EZ 1% IODOPHOR (MISCELLANEOUS) ×4 IMPLANT
BSKT STON RTRVL ZERO TP 1.9FR (BASKET)
CATH URETL 5X70 OPEN END (CATHETERS) ×4 IMPLANT
CNTNR SPEC 2.5X3XGRAD LEK (MISCELLANEOUS)
CONRAY 43 FOR UROLOGY 50M (MISCELLANEOUS) ×4 IMPLANT
CONT SPEC 4OZ STER OR WHT (MISCELLANEOUS)
CONT SPEC 4OZ STRL OR WHT (MISCELLANEOUS)
CONTAINER SPEC 2.5X3XGRAD LEK (MISCELLANEOUS) IMPLANT
DRAPE UTILITY 15X26 TOWEL STRL (DRAPES) ×4 IMPLANT
FIBER LASER LITHO 273 (Laser) IMPLANT
GLIDEWIRE STIFF .35X180X3 HYDR (WIRE) ×3 IMPLANT
GLOVE BIO SURGEON STRL SZ 6.5 (GLOVE) ×3 IMPLANT
GLOVE BIO SURGEONS STRL SZ 6.5 (GLOVE) ×1
GOWN STRL REUS W/ TWL LRG LVL3 (GOWN DISPOSABLE) ×4 IMPLANT
GOWN STRL REUS W/TWL LRG LVL3 (GOWN DISPOSABLE) ×8
GUIDEWIRE GREEN .038 145CM (MISCELLANEOUS) IMPLANT
INFUSOR MANOMETER BAG 3000ML (MISCELLANEOUS) ×4 IMPLANT
INTRODUCER DILATOR DOUBLE (INTRODUCER) IMPLANT
KIT TURNOVER CYSTO (KITS) ×4 IMPLANT
PACK CYSTO AR (MISCELLANEOUS) ×4 IMPLANT
SENSORWIRE 0.038 NOT ANGLED (WIRE) ×4
SET CYSTO W/LG BORE CLAMP LF (SET/KITS/TRAYS/PACK) ×4 IMPLANT
SHEATH URETERAL 12FRX35CM (MISCELLANEOUS) IMPLANT
SOL .9 NS 3000ML IRR  AL (IV SOLUTION) ×2
SOL .9 NS 3000ML IRR AL (IV SOLUTION) ×2
SOL .9 NS 3000ML IRR UROMATIC (IV SOLUTION) ×2 IMPLANT
STENT URET 6FRX24 CONTOUR (STENTS) IMPLANT
STENT URET 6FRX26 CONTOUR (STENTS) IMPLANT
SURGILUBE 2OZ TUBE FLIPTOP (MISCELLANEOUS) ×4 IMPLANT
WATER STERILE IRR 1000ML POUR (IV SOLUTION) ×4 IMPLANT
WIRE SENSOR 0.038 NOT ANGLED (WIRE) ×2 IMPLANT

## 2018-02-08 NOTE — Anesthesia Preprocedure Evaluation (Signed)
Anesthesia Evaluation  Patient identified by MRN, date of birth, ID band Patient awake    Reviewed: Allergy & Precautions, NPO status , Patient's Chart, lab work & pertinent test results, reviewed documented beta blocker date and time   Airway Mallampati: III  TM Distance: >3 FB     Dental  (+) Chipped   Pulmonary           Cardiovascular hypertension, Pt. on medications + CAD, + Past MI, + CABG and + Peripheral Vascular Disease       Neuro/Psych    GI/Hepatic   Endo/Other    Renal/GU Renal disease     Musculoskeletal   Abdominal   Peds  Hematology   Anesthesia Other Findings Obese.  Reproductive/Obstetrics                             Anesthesia Physical Anesthesia Plan  ASA: III  Anesthesia Plan: General   Post-op Pain Management:    Induction: Intravenous  PONV Risk Score and Plan:   Airway Management Planned: LMA  Additional Equipment:   Intra-op Plan:   Post-operative Plan:   Informed Consent: I have reviewed the patients History and Physical, chart, labs and discussed the procedure including the risks, benefits and alternatives for the proposed anesthesia with the patient or authorized representative who has indicated his/her understanding and acceptance.     Plan Discussed with: CRNA  Anesthesia Plan Comments:         Anesthesia Quick Evaluation

## 2018-02-08 NOTE — Anesthesia Postprocedure Evaluation (Signed)
Anesthesia Post Note  Patient: Robert Fields  Procedure(s) Performed: CYSTOSCOPY/RETROGRADE/URETEROSCOPY (Left Ureter)  Patient location during evaluation: PACU Anesthesia Type: General Level of consciousness: awake and alert Pain management: pain level controlled Vital Signs Assessment: post-procedure vital signs reviewed and stable Respiratory status: spontaneous breathing, nonlabored ventilation, respiratory function stable and patient connected to nasal cannula oxygen Cardiovascular status: blood pressure returned to baseline and stable Postop Assessment: no apparent nausea or vomiting Anesthetic complications: no     Last Vitals:  Vitals:   02/08/18 1314 02/08/18 1332  BP: (!) 144/80 139/80  Pulse: (!) 59 67  Resp:    Temp:    SpO2: 96% 97%    Last Pain:  Vitals:   02/08/18 1314  TempSrc:   PainSc: 0-No pain                 Jerremy Maione S

## 2018-02-08 NOTE — OR Nursing (Signed)
Discharge instructions discussed with pt and wife. Both voice understanding. 

## 2018-02-08 NOTE — Interval H&P Note (Signed)
History and Physical Interval Note:  02/08/2018 11:07 AM  Robert Fields  has presented today for surgery, with the diagnosis of Right ureteral stone  The various methods of treatment have been discussed with the patient and family. After consideration of risks, benefits and other options for treatment, the patient has consented to  Procedure(s): CYSTOSCOPY/URETEROSCOPY/HOLMIUM LASER/STENT PLACEMENT (Right) as a surgical intervention .  The patient's history has been reviewed, patient examined, no change in status, stable for surgery.  I have reviewed the patient's chart and labs.  Questions were answered to the patient's satisfaction.    Patient was noted to have a large 13 mm obstructing right ureteral stone on CT urogram.  Rather than pursue office cystoscopy, right ureteroscopy with laser lithotripsy and stent placement was discussed.  All questions were answered.  RRR CTAB  Vanna ScotlandAshley Doye Montilla

## 2018-02-08 NOTE — Transfer of Care (Signed)
Immediate Anesthesia Transfer of Care Note  Patient: Robert Fields  Procedure(s) Performed: CYSTOSCOPY/RETROGRADE/URETEROSCOPY (Left Ureter)  Patient Location: PACU  Anesthesia Type:General  Level of Consciousness: sedated  Airway & Oxygen Therapy: Patient Spontanous Breathing and Patient connected to face mask oxygen  Post-op Assessment: Report given to RN and Post -op Vital signs reviewed and stable  Post vital signs: Reviewed and stable  Last Vitals:  Vitals Value Taken Time  BP 111/74 02/08/2018 12:31 PM  Temp 36.6 C 02/08/2018 12:31 PM  Pulse 70 02/08/2018 12:37 PM  Resp 13 02/08/2018 12:37 PM  SpO2 100 % 02/08/2018 12:37 PM  Vitals shown include unvalidated device data.  Last Pain:  Vitals:   02/08/18 1231  TempSrc:   PainSc: Asleep         Complications: No apparent anesthesia complications

## 2018-02-08 NOTE — Discharge Instructions (Signed)
Ureteroscopy, Care After °This sheet gives you information about how to care for yourself after your procedure. Your health care provider may also give you more specific instructions. If you have problems or questions, contact your health care provider. °What can I expect after the procedure? °After the procedure, it is common to have: °· A burning sensation when you urinate. °· Blood in your urine. °· Mild discomfort in the bladder area or kidney area when urinating. °· Needing to urinate more often or urgently. ° °Follow these instructions at home: °Medicines °· Take over-the-counter and prescription medicines only as told by your health care provider. °· If you were prescribed an antibiotic medicine, take it as told by your health care provider. Do not stop taking the antibiotic even if you start to feel better. °General instructions ° °· Do not drive for 24 hours if you were given a medicine to help you relax (sedative) during your procedure. °· To relieve burning, try taking a warm bath or holding a warm washcloth over your groin. °· Drink enough fluid to keep your urine clear or pale yellow. °? Drink two 8-ounce glasses of water every hour for the first 2 hours after you get home. °? Continue to drink water often at home. °· You can eat what you usually do. °· Keep all follow-up visits as told by your health care provider. This is important. °? If you had a tube placed to keep urine flowing (ureteral stent), ask your health care provider when you need to return to have it removed. °Contact a health care provider if: °· You have chills or a fever. °· You have burning pain for longer than 24 hours after the procedure. °· You have blood in your urine for longer than 24 hours after the procedure. °Get help right away if: °· You have large amounts of blood in your urine. °· You have blood clots in your urine. °· You have very bad pain. °· You have chest pain or trouble breathing. °· You are unable to urinate and  you have the feeling of a full bladder. °This information is not intended to replace advice given to you by your health care provider. Make sure you discuss any questions you have with your health care provider. °Document Released: 07/12/2013 Document Revised: 04/22/2016 Document Reviewed: 04/18/2016 °Elsevier Interactive Patient Education © 2018 [employer] ° ° °AMBULATORY SURGERY  °DISCHARGE INSTRUCTIONS ° ° °1) The drugs that you were given will stay in your system until tomorrow so for the next 24 hours you should not: ° °A) Drive an automobile °B) Make any legal decisions °C) Drink any alcoholic beverage ° ° °2) You may resume regular meals tomorrow.  Today it is better to start with liquids and gradually work up to solid foods. ° °You may eat anything you prefer, but it is better to start with liquids, then soup and crackers, and gradually work up to solid foods. ° ° °3) Please notify your doctor immediately if you have any unusual bleeding, trouble breathing, redness and pain at the surgery site, drainage, fever, or pain not relieved by medication. ° ° ° °4) Additional Instructions: ° ° ° ° ° ° ° °Please contact your physician with any problems or Same Day Surgery at [phone], Monday through Friday 6 am to 4 pm, or [place] [facility] [place] Main number at [phone]. ° °

## 2018-02-08 NOTE — Anesthesia Procedure Notes (Signed)
Procedure Name: LMA Insertion Date/Time: 02/08/2018 11:38 AM Performed by: Junious SilkNoles, Tawna Alwin, CRNA Pre-anesthesia Checklist: Patient identified, Patient being monitored, Timeout performed, Emergency Drugs available and Suction available Patient Re-evaluated:Patient Re-evaluated prior to induction Oxygen Delivery Method: Circle system utilized Preoxygenation: Pre-oxygenation with 100% oxygen Induction Type: IV induction Ventilation: Mask ventilation without difficulty LMA: LMA inserted LMA Size: 4.5 Tube type: Oral Number of attempts: 1 Placement Confirmation: positive ETCO2 and breath sounds checked- equal and bilateral Tube secured with: Tape Dental Injury: Teeth and Oropharynx as per pre-operative assessment

## 2018-02-08 NOTE — Op Note (Signed)
Date of procedure: 02/08/18  Preoperative diagnosis:  1. Right obstructing ureteral calculus 2. Severe right hydroureteronephrosis  Postoperative diagnosis:  1. Same as above 2. Severely impacted right ureteral stone with complete obstruction   Procedure: 1. Cystoscopy 2. Right retrograde pyelogram 3. Right ureteroscopy 4. Failed ureteral stent placement  Surgeon: Vanna Scotland, MD  Anesthesia: General  Complications: None  Intraoperative findings: Retrograde pyelogram with complete obstruction, no contrast material above level of stone new the level of the iliacs.  Unable to pass wire despite multiple maneuvers.  Copious bullous edema surrounding the stone.   EBL: Minimal  Specimens: None  Drains: None  Indication: SALAAM BATTERSHELL is a 67 y.o. patient with 13 mm obstructing stone with high-grade obstruction of the level of the iliacs and will severe hydroureteronephrosis and renal atrophy.  After reviewing the management options for treatment, he elected to proceed with the above surgical procedure(s). We have discussed the potential benefits and risks of the procedure, side effects of the proposed treatment, the likelihood of the patient achieving the goals of the procedure, and any potential problems that might occur during the procedure or recuperation. Informed consent has been obtained.  Typically in the preoperative holding area, we did discuss that the stone appears to be quite chronic and there is a high risk of impaction failure to place stent or treat the stone if a safety wire can be passed.  He understands all this.  Description of procedure:  The patient was taken to the operating room and general anesthesia was induced.  The patient was placed in the dorsal lithotomy position, prepped and draped in the usual sterile fashion, and preoperative antibiotics were administered. A preoperative time-out was performed.   A 21 French scope was advanced per urethra into the  bladder.  The bladder was carefully inspected noted to be free of any injuries, lesions, tumors, stones, or ulcerations.  Attention was then turned to the right ureteral orifice.  On scout imaging, the stone could be seen at the level of the iliacs.  I then intubated the right ureteral orifice with a 5 [place] open-ended ureteral catheter and injected contrast solution.  There was contrast filling a decompressed distal ureter up to level of stone.  Notably, there was a halo like a ring around the stone with absolutely no contrast traversing above this level despite higher pressure injection.  And then attempted to place a sensor wire unsuccessfully followed by an unsuccessful placement of the guidewire.  I did try multiple maneuvers including advancing a 4.5 semirigid ureteroscope up to level the stone.  Notably upon doing this, there was a large amount of tissue and bullous edema around the stone consistent with severe impaction.  Under direct visualization, I tried to place a wire on multiple different locations around the stone but continued to be unsuccessful.  I was unable to get an angled Glidewire either.  Another retrograde pyelogram at this level showed no contrast extravasation or injury of the distal ureter but absolutely no contrast consistent with high-grade complete ureteral obstruction secondary to this highly impacted stone.  Given the failure to place a safety wire, did not feel comfortable lasering the stone.  As such, the scope was removed, the bladder was drained, the patient was then clean and dry, repositioned supine position, reversed by anesthesia, taken to PACU in stable condition.  Plan: I discussed the findings both with the patient but primarily is wife today in the recovery room.  We will go ahead and  arrange for percutaneous nephrostomy tube placement in the near future, hopefully tomorrow in the interim, we will keep him on antibiotics due to concern for possible infection following  manipulation and an obstructed system.  Careful return precautions were reviewed.  After nephrostomy tube is in place, given the degree of atrophy, will get Lasix renogram to assess his overall renal function prior to pursuing any further endoscopic intervention for the stone.  The patient's wife is agreeable this plan.   Vanna ScotlandAshley Avanti Jetter, M.D.

## 2018-02-08 NOTE — Telephone Encounter (Addendum)
Informed patient of right nephrostomy tube placement scheduled in Interventional Radiology on 02/11/2018 with arrival time of 1:00. Instructed patient to go to the Emergency Room if he develops fever prior to tube placement. Instructions given. Questions answered. Patient voices understanding.

## 2018-02-08 NOTE — Anesthesia Post-op Follow-up Note (Signed)
Anesthesia QCDR form completed.        

## 2018-02-09 ENCOUNTER — Telehealth: Payer: Self-pay | Admitting: Urology

## 2018-02-09 NOTE — Telephone Encounter (Signed)
When do you want him to have his lasix study? And does he need a follow up app for results? I see that he is having something done on the 25th.   Robert DusterMichelle

## 2018-02-09 NOTE — Telephone Encounter (Signed)
How about 1 week after nephrostomy tube placement.  Yes he needs to follow-up with me after the study.  Vanna ScotlandAshley Taria Castrillo, MD

## 2018-02-10 ENCOUNTER — Other Ambulatory Visit: Payer: Self-pay | Admitting: Radiology

## 2018-02-10 ENCOUNTER — Emergency Department
Admission: EM | Admit: 2018-02-10 | Discharge: 2018-02-10 | Disposition: A | Discharge status: Home / Self Care | Payer: BC Managed Care – PPO | Attending: Emergency Medicine | Admitting: Emergency Medicine

## 2018-02-10 ENCOUNTER — Encounter: Payer: Self-pay | Admitting: Emergency Medicine

## 2018-02-10 DIAGNOSIS — Z7982 Long term (current) use of aspirin: Secondary | ICD-10-CM | POA: Diagnosis not present

## 2018-02-10 DIAGNOSIS — Z951 Presence of aortocoronary bypass graft: Secondary | ICD-10-CM | POA: Insufficient documentation

## 2018-02-10 DIAGNOSIS — I2510 Atherosclerotic heart disease of native coronary artery without angina pectoris: Secondary | ICD-10-CM | POA: Insufficient documentation

## 2018-02-10 DIAGNOSIS — Z79899 Other long term (current) drug therapy: Secondary | ICD-10-CM | POA: Insufficient documentation

## 2018-02-10 DIAGNOSIS — I10 Essential (primary) hypertension: Secondary | ICD-10-CM | POA: Insufficient documentation

## 2018-02-10 DIAGNOSIS — R319 Hematuria, unspecified: Secondary | ICD-10-CM

## 2018-02-10 DIAGNOSIS — N200 Calculus of kidney: Secondary | ICD-10-CM | POA: Insufficient documentation

## 2018-02-10 LAB — CBC
HEMATOCRIT: 42.1 % (ref 40.0–52.0)
HEMOGLOBIN: 14.4 g/dL (ref 13.0–18.0)
MCH: 31.2 pg (ref 26.0–34.0)
MCHC: 34.1 g/dL (ref 32.0–36.0)
MCV: 91.3 fL (ref 80.0–100.0)
Platelets: 198 10*3/uL (ref 150–440)
RBC: 4.6 MIL/uL (ref 4.40–5.90)
RDW: 13.2 % (ref 11.5–14.5)
WBC: 6.7 10*3/uL (ref 3.8–10.6)

## 2018-02-10 LAB — COMPREHENSIVE METABOLIC PANEL
ALK PHOS: 34 U/L — AB (ref 38–126)
ALT: 26 U/L (ref 0–44)
ANION GAP: 5 (ref 5–15)
AST: 27 U/L (ref 15–41)
Albumin: 4.5 g/dL (ref 3.5–5.0)
BUN: 25 mg/dL — ABNORMAL HIGH (ref 8–23)
CALCIUM: 9.3 mg/dL (ref 8.9–10.3)
CO2: 29 mmol/L (ref 22–32)
Chloride: 104 mmol/L (ref 98–111)
Creatinine, Ser: 1.58 mg/dL — ABNORMAL HIGH (ref 0.61–1.24)
GFR calc non Af Amer: 45 mL/min — ABNORMAL LOW (ref >60)
GFR, EST AFRICAN AMERICAN: 52 mL/min — AB (ref >60)
Glucose, Bld: 109 mg/dL — ABNORMAL HIGH (ref 70–99)
POTASSIUM: 3.9 mmol/L (ref 3.5–5.1)
Sodium: 138 mmol/L (ref 135–145)
TOTAL PROTEIN: 8 g/dL (ref 6.5–8.1)
Total Bilirubin: 0.7 mg/dL (ref 0.3–1.2)

## 2018-02-10 LAB — URINALYSIS, COMPLETE (UACMP) WITH MICROSCOPIC
Bacteria, UA: NONE SEEN
Bilirubin Urine: NEGATIVE
Glucose, UA: NEGATIVE mg/dL
Ketones, ur: NEGATIVE mg/dL
Leukocytes, UA: NEGATIVE
Nitrite: NEGATIVE
Protein, ur: 30 mg/dL — AB
RBC / HPF: >50 RBC/hpf — ABNORMAL HIGH (ref 0–5)
Specific Gravity, Urine: 1.014 (ref 1.005–1.030)
Squamous Epithelial / HPF: NONE SEEN (ref 0–5)
pH: 6 (ref 5.0–8.0)

## 2018-02-10 NOTE — ED Notes (Signed)
Pt verbally discharged by MD. Misunderstood waiting on paperwork.

## 2018-02-10 NOTE — ED Provider Notes (Signed)
[facility] Emergency Department Provider Note  ____________________________________________   First MD Initiated Contact with Patient 02/10/18 1118     (approximate)  I have reviewed the triage vital signs and the nursing notes.   HISTORY  Chief Complaint Nausea    HPI Robert Fields is a 67 y.o. male who self presents to the emergency department after an episode of nausea and feeling "flushed" along with some hematuria.  He has a 13 mm kidney stone on the right that Dr. Apolinar Junes recently scoped and was unable to pass a stent across.  He has a scheduled tomorrow for a nephrostomy tube.  He has had no fevers or chills.  No chest pain or shortness of breath.  No dysuria frequency or hesitance.  He comes to the emergency department today because he thought that his hematuria should have resolved at this point and he wanted to get checked prior to his procedure tomorrow.  His symptoms are mild constant nothing seems to make them better or worse.  There are nonradiating.    Past Medical History:  Diagnosis Date  . CAD (coronary artery disease)   . History of kidney stones   . Hyperlipemia   . Hypertension   . MI (myocardial infarction) [facility])     Patient Active Problem List   Diagnosis Date Noted  . Coronary artery disease 01/11/2018  . Hyperlipidemia 01/11/2018  . Hydronephrosis of right kidney 12/24/2017  . Persistent cough 01/19/2017  . Peripheral vascular disease (HCC) 10/17/2015  . Essential hypertension 04/17/2014  . Hematuria, microscopic 04/17/2014    Past Surgical History:  Procedure Laterality Date  . CARDIAC CATHETERIZATION    . CORONARY ARTERY BYPASS GRAFT  2004   5 vessel  . CYSTOSCOPY/RETROGRADE/URETEROSCOPY Left 02/08/2018   Procedure: CYSTOSCOPY/RETROGRADE/URETEROSCOPY;  Surgeon: Vanna Scotland, MD;  Location: [facility] ORS;  Service: Urology;  Laterality: Left;    Prior to Admission medications   Medication Sig Start Date End Date  Taking? Authorizing Provider  aspirin EC 81 MG tablet Take 81 mg by mouth at bedtime.     [provider]  ezetimibe (ZETIA) 10 MG tablet Take 10 mg by mouth once daily at night 03/20/17   [provider]  losartan (COZAAR) 50 MG tablet Take 50 mg by mouth at bedtime.  12/27/17   [provider]  Multiple Vitamin (MULTI-VITAMINS) TABS Take 1 tablet by mouth at bedtime.     [provider]  oxyCODONE-acetaminophen (PERCOCET) 5-325 MG tablet Take 1-2 tablets by mouth every 4 (four) hours as needed for moderate pain or severe pain. 02/08/18   Vanna Scotland, MD  rosuvastatin (CRESTOR) 40 MG tablet Take 40 mg by mouth at bedtime.  11/25/17   [provider]  sulfamethoxazole-trimethoprim (BACTRIM DS,SEPTRA DS) 800-160 MG tablet Take 1 tablet by mouth 2 (two) times daily. 02/08/18   Vanna Scotland, MD    Allergies Doxycycline hyclate  Family History  Problem Relation Age of Onset  . Heart failure Mother   . Lung cancer Mother     Social History Social History   Tobacco Use  . Smoking status: Never Smoker  . Smokeless tobacco: Never Used  Substance Use Topics  . Alcohol use: Yes    Alcohol/week: 1.2 oz    Types: 2 Cans of beer per week  . Drug use: Never    Review of Systems Constitutional: No fever/chills Eyes: No visual changes. ENT: No sore throat. Cardiovascular: Denies chest pain. Respiratory: Denies shortness of breath. Gastrointestinal:  No abdominal pain.  Positive for nausea, no vomiting.  No diarrhea.  No constipation. Genitourinary: Negative for dysuria. Musculoskeletal: Negative for back pain. Skin: Negative for rash. Neurological: Negative for headaches, focal weakness or numbness.   ____________________________________________   PHYSICAL EXAM:  VITAL SIGNS: ED Triage Vitals  Enc Vitals Group     BP 02/10/18 0936 (!) 150/89     Pulse Rate 02/10/18 0936 60     Resp 02/10/18 0936 18     Temp 02/10/18 0936 97.9 F  (36.6 C)     Temp Source 02/10/18 0936 Oral     SpO2 02/10/18 0936 99 %     Weight 02/10/18 0936 230 lb (104.3 kg)     Height 02/10/18 0936 6\' 1"  (1.854 m)     Head Circumference --      Peak Flow --      Pain Score 02/10/18 0940 0     Pain Loc --      Pain Edu? --      Excl. in GC? --     Constitutional: Alert and oriented x4 well-appearing nontoxic no diaphoresis Eyes: PERRL EOMI. Head: Atraumatic. Nose: No congestion/rhinnorhea. Mouth/Throat: No trismus Neck: No stridor.   Cardiovascular: Normal rate, regular rhythm. Grossly normal heart sounds.  Good peripheral circulation. Respiratory: Normal respiratory effort.  No retractions. Lungs CTAB and moving good air Gastrointestinal: Soft nontender Musculoskeletal: No lower extremity edema   Neurologic:  Normal speech and language. No gross focal neurologic deficits are appreciated. Skin:  Skin is warm, dry and intact. No rash noted. Psychiatric: Mood and affect are normal. Speech and behavior are normal.    ____________________________________________   DIFFERENTIAL includes but not limited to  Infected stone, urinary tract infection, pyelonephritis, renal failure ____________________________________________   LABS (all labs ordered are listed, but only abnormal results are displayed)  Labs Reviewed  COMPREHENSIVE METABOLIC PANEL - Abnormal; Notable for the following components:      Result Value   Glucose, Bld 109 (*)    BUN 25 (*)    Creatinine, Ser 1.58 (*)    Alkaline Phosphatase 34 (*)    GFR calc non Af Amer 45 (*)    GFR calc Af Amer 52 (*)    All other components within normal limits  URINALYSIS, COMPLETE (UACMP) WITH MICROSCOPIC - Abnormal; Notable for the following components:   Color, Urine YELLOW (*)    APPearance HAZY (*)    Hgb urine dipstick LARGE (*)    Protein, ur 30 (*)    RBC / HPF >50 (*)    All other components within normal limits  CBC    Lab work reviewed by me shows hematuria with no  bacteria and no whites.  Slightly worsening renal function __________________________________________  EKG   ____________________________________________  RADIOLOGY   ____________________________________________   PROCEDURES  Procedure(s) performed: no  Procedures  Critical Care performed: no  ____________________________________________   INITIAL IMPRESSION / ASSESSMENT AND PLAN / ED COURSE  Pertinent labs & imaging results that were available during my care of the patient were reviewed by me and considered in my medical decision making (see chart for details).       As part of my medical decision making, I reviewed the following data within the electronic MEDICAL RECORD NUMBER History obtained from family if available, nursing notes, old chart and ekg, as well as notes from prior ED visits.   The patient arrives hemodynamically stable and quite well-appearing.  I appreciate that his renal  function is slightly worse than previous however not significantly so.  He is continuing to take his antibiotics as prescribed.  I will reach out to Dr. Apolinar Junes to discuss although I do anticipate outpatient management. ----------------------------------------- 12:24 PM on 02/10/2018 -----------------------------------------  I spoke with Dr. Apolinar Junes who personally reviewed the patient's lab work from today and as the patient is well-appearing, afebrile, and not in any pain she is comfortable having him discharged with follow-up tomorrow as scheduled.  I agree.  The patient and his wife are comfortable with this plan.  Strict return precautions have been given and he verbalizes understanding agreement with the plan. ____________________________________________   FINAL CLINICAL IMPRESSION(S) / ED DIAGNOSES  Final diagnoses:  Kidney stone  Hematuria, unspecified type      NEW MEDICATIONS STARTED DURING THIS VISIT:  New Prescriptions   No medications on file     Note:  This  document was prepared using Dragon voice recognition software and may include unintentional dictation errors.     Merrily Brittle, MD 02/10/18 1226

## 2018-02-10 NOTE — Discharge Instructions (Addendum)
Please keep your appointment for nephrostomy tube tomorrow as scheduled.  Return to the ED for any concerns particularly for any FEVERS at all.  It was a pleasure to take care of you today, and thank you for coming to our emergency department.  If you have any questions or concerns before leaving please ask the nurse to grab me and I'm more than happy to go through your aftercare instructions again.  If you were prescribed any opioid pain medication today such as Norco, Vicodin, Percocet, morphine, hydrocodone, or oxycodone please make sure you do not drive when you are taking this medication as it can alter your ability to drive safely.  If you have any concerns once you are home that you are not improving or are in fact getting worse before you can make it to your follow-up appointment, please do not hesitate to call [phone] and come back for further evaluation.  Merrily Brittle, MD  Results for orders placed or performed during the hospital encounter of 02/10/18  Comprehensive metabolic panel  Result Value Ref Range   Sodium 138 135 - 145 mmol/L   Potassium 3.9 3.5 - 5.1 mmol/L   Chloride 104 98 - 111 mmol/L   CO2 29 22 - 32 mmol/L   Glucose, Bld 109 (H) 70 - 99 mg/dL   BUN 25 (H) 8 - 23 mg/dL   Creatinine, Ser 1.61 (H) 0.61 - 1.24 mg/dL   Calcium 9.3 8.9 - 09.6 mg/dL   Total Protein 8.0 6.5 - 8.1 g/dL   Albumin 4.5 3.5 - 5.0 g/dL   AST 27 15 - 41 U/L   ALT 26 0 - 44 U/L   Alkaline Phosphatase 34 (L) 38 - 126 U/L   Total Bilirubin 0.7 0.3 - 1.2 mg/dL   GFR calc non Af Amer 45 (L) >60 mL/min   GFR calc Af Amer 52 (L) >60 mL/min   Anion gap 5 5 - 15  CBC  Result Value Ref Range   WBC 6.7 3.8 - 10.6 K/uL   RBC 4.60 4.40 - 5.90 MIL/uL   Hemoglobin 14.4 13.0 - 18.0 g/dL   HCT 04.5 40.9 - 81.1 %   MCV 91.3 80.0 - 100.0 fL   MCH 31.2 26.0 - 34.0 pg   MCHC 34.1 32.0 - 36.0 g/dL   RDW 91.4 78.2 - 95.6 %   Platelets 198 150 - 440 K/uL  Urinalysis, Complete w Microscopic  Result Value  Ref Range   Color, Urine YELLOW (A) YELLOW   APPearance HAZY (A) CLEAR   Specific Gravity, Urine 1.014 1.005 - 1.030   pH 6.0 5.0 - 8.0   Glucose, UA NEGATIVE NEGATIVE mg/dL   Hgb urine dipstick LARGE (A) NEGATIVE   Bilirubin Urine NEGATIVE NEGATIVE   Ketones, ur NEGATIVE NEGATIVE mg/dL   Protein, ur 30 (A) NEGATIVE mg/dL   Nitrite NEGATIVE NEGATIVE   Leukocytes, UA NEGATIVE NEGATIVE   RBC / HPF >50 (H) 0 - 5 RBC/hpf   WBC, UA 0-5 0 - 5 WBC/hpf   Bacteria, UA NONE SEEN NONE SEEN   Squamous Epithelial / LPF NONE SEEN 0 - 5   Ct Hematuria Workup  Result Date: 01/22/2018 CLINICAL DATA:  67 year old male with history of severe right hydroureteronephrosis. EXAM: CT ABDOMEN AND PELVIS WITHOUT AND WITH CONTRAST TECHNIQUE: Multidetector CT imaging of the abdomen and pelvis was performed following the standard protocol before and following the bolus administration of intravenous contrast. CONTRAST:  ISOVUE-300 IOPAMIDOL (ISOVUE-300) INJECTION 61% COMPARISON:  None. FINDINGS: Lower chest: Atherosclerotic calcifications in the left circumflex and right coronary arteries. Status post median sternotomy for CABG. Hepatobiliary: No suspicious cystic or solid hepatic lesions. No intra or extrahepatic biliary ductal dilatation. Gallbladder is normal in appearance. Pancreas: No pancreatic mass. No pancreatic ductal dilatation. No pancreatic or peripancreatic fluid or inflammatory changes. Spleen: Unremarkable. Adrenals/Urinary Tract: Several nonobstructive calculi are noted in the collecting system of the left kidney, largest of which measures 11 mm in the upper pole. In the distal third of the right ureter there is a large calculus measuring 13 mm in diameter (axial image 67 of series 2). This is associated with severe proximal hydroureteronephrosis. Some moderate cortical atrophy is also noted in the right kidney. Right kidney is otherwise unremarkable in appearance. Subcentimeter low-attenuation lesion in  the anterior aspect of the left kidney. No other suspicious renal lesions. No left-sided hydroureteronephrosis. Urinary bladder is normal in appearance. Bilateral adrenal glands are normal in appearance. Stomach/Bowel: Normal appearance of the stomach no pathologic dilatation of small bowel or colon. Normal appendix. Vascular/Lymphatic: Aortic atherosclerosis, without evidence of aneurysm or dissection in the abdominal or pelvic vasculature. No lymphadenopathy noted in the abdomen or pelvis. Reproductive: Prostate gland and seminal vesicles are unremarkable in appearance. Other: No significant volume of ascites.  No pneumoperitoneum. Musculoskeletal: There are no aggressive appearing lytic or blastic lesions noted in the visualized portions of the skeleton. IMPRESSION: 1. 13 mm obstructive calculus in the distal third of the right ureter with severe proximal right hydroureteronephrosis and moderate right cortical atrophy. 2. Multiple additional nonobstructive left sided renal calculi in the collecting system measuring up to 11 mm in the upper pole. No left ureteral stones. 3. Aortic atherosclerosis, in addition to at least 2 vessel coronary artery disease. Please note that although the presence of coronary artery calcium documents the presence of coronary artery disease, the severity of this disease and any potential stenosis cannot be assessed on this non-gated CT examination. Assessment for potential risk factor modification, dietary therapy or pharmacologic therapy may be warranted, if clinically indicated. 4. Additional incidental findings, as above. Electronically Signed   By: Trudie Reedaniel  Entrikin M.D.   On: 01/22/2018 09:40

## 2018-02-10 NOTE — ED Triage Notes (Signed)
Patient presents to ED via POV from home with c/o nausea. Patient reports having a lithotripsy done on Monday and was told to come in to the ED if he had any wosening symptoms. Patient reports "feeling flushed all the time". Patient reports he had an appointment tomorrow to get a stent placed to right kidney.

## 2018-02-11 ENCOUNTER — Ambulatory Visit
Admission: RE | Admit: 2018-02-11 | Discharge: 2018-02-11 | Disposition: A | Discharge status: Home / Self Care | Payer: BC Managed Care – PPO | Source: Ambulatory Visit | Attending: Urology | Admitting: Urology

## 2018-02-11 DIAGNOSIS — E785 Hyperlipidemia, unspecified: Secondary | ICD-10-CM | POA: Diagnosis not present

## 2018-02-11 DIAGNOSIS — I2510 Atherosclerotic heart disease of native coronary artery without angina pectoris: Secondary | ICD-10-CM | POA: Insufficient documentation

## 2018-02-11 DIAGNOSIS — N132 Hydronephrosis with renal and ureteral calculous obstruction: Secondary | ICD-10-CM | POA: Insufficient documentation

## 2018-02-11 DIAGNOSIS — Z951 Presence of aortocoronary bypass graft: Secondary | ICD-10-CM | POA: Insufficient documentation

## 2018-02-11 DIAGNOSIS — I10 Essential (primary) hypertension: Secondary | ICD-10-CM | POA: Insufficient documentation

## 2018-02-11 DIAGNOSIS — Z7982 Long term (current) use of aspirin: Secondary | ICD-10-CM | POA: Diagnosis not present

## 2018-02-11 DIAGNOSIS — I252 Old myocardial infarction: Secondary | ICD-10-CM | POA: Diagnosis not present

## 2018-02-11 HISTORY — PX: IR NEPHROSTOMY PLACEMENT RIGHT: IMG6064

## 2018-02-11 LAB — BASIC METABOLIC PANEL
Anion gap: 9 (ref 5–15)
BUN: 27 mg/dL — ABNORMAL HIGH (ref 8–23)
CALCIUM: 9 mg/dL (ref 8.9–10.3)
CO2: 23 mmol/L (ref 22–32)
Chloride: 105 mmol/L (ref 98–111)
Creatinine, Ser: 1.53 mg/dL — ABNORMAL HIGH (ref 0.61–1.24)
GFR, EST AFRICAN AMERICAN: 54 mL/min — AB (ref >60)
GFR, EST NON AFRICAN AMERICAN: 46 mL/min — AB (ref >60)
Glucose, Bld: 101 mg/dL — ABNORMAL HIGH (ref 70–99)
Potassium: 4 mmol/L (ref 3.5–5.1)
SODIUM: 137 mmol/L (ref 135–145)

## 2018-02-11 LAB — CBC
HCT: 41.9 % (ref 40.0–52.0)
Hemoglobin: 14.4 g/dL (ref 13.0–18.0)
MCH: 31.3 pg (ref 26.0–34.0)
MCHC: 34.2 g/dL (ref 32.0–36.0)
MCV: 91.4 fL (ref 80.0–100.0)
PLATELETS: 201 10*3/uL (ref 150–440)
RBC: 4.59 MIL/uL (ref 4.40–5.90)
RDW: 13.2 % (ref 11.5–14.5)
WBC: 5.9 10*3/uL (ref 3.8–10.6)

## 2018-02-11 LAB — PROTIME-INR
INR: 1.07
PROTHROMBIN TIME: 13.8 s (ref 11.4–15.2)

## 2018-02-11 MED ORDER — CIPROFLOXACIN IN D5W 400 MG/200ML IV SOLN
INTRAVENOUS | Status: AC
  Administered 2018-02-11: 15:00:00
  Filled 2018-02-11: qty 200

## 2018-02-11 MED ORDER — HYDROCODONE-ACETAMINOPHEN 5-325 MG PO TABS: 1.0000 | ORAL_TABLET | ORAL | Status: DC | PRN

## 2018-02-11 MED ORDER — IOPAMIDOL (ISOVUE-300) INJECTION 61%
30.0000 mL | Freq: Once | INTRAVENOUS | Status: AC | PRN
  Administered 2018-02-11: 10 mL

## 2018-02-11 MED ORDER — MIDAZOLAM HCL 5 MG/5ML IJ SOLN
INTRAMUSCULAR | Status: AC
  Filled 2018-02-11: qty 5

## 2018-02-11 MED ORDER — FENTANYL CITRATE (PF) 100 MCG/2ML IJ SOLN
INTRAMUSCULAR | Status: AC | PRN
  Administered 2018-02-11 (×2): 50 ug via INTRAVENOUS

## 2018-02-11 MED ORDER — SODIUM CHLORIDE 0.9 % IV SOLN
INTRAVENOUS | Status: DC
  Administered 2018-02-11: 14:00:00 via INTRAVENOUS

## 2018-02-11 MED ORDER — MIDAZOLAM HCL 2 MG/2ML IJ SOLN
INTRAMUSCULAR | Status: AC | PRN
  Administered 2018-02-11 (×2): 1 mg via INTRAVENOUS

## 2018-02-11 MED ORDER — CIPROFLOXACIN IN D5W 400 MG/200ML IV SOLN: 400.0000 mg | INTRAVENOUS | Status: DC

## 2018-02-11 MED ORDER — LIDOCAINE HCL (PF) 1 % IJ SOLN
INTRAMUSCULAR | Status: AC
  Filled 2018-02-11: qty 30

## 2018-02-11 MED ORDER — FENTANYL CITRATE (PF) 100 MCG/2ML IJ SOLN
INTRAMUSCULAR | Status: AC
  Filled 2018-02-11: qty 4

## 2018-02-11 NOTE — H&P (Signed)
Chief Complaint: Patient was seen in consultation today for right nephrostomy tube placement at the request of Robert Fields,Robert Fields  Referring Physician(s): Robert Fields,Robert Fields  Patient Status: [facility] - Out-pt  History of Present Illness: Robert Fields is a 67 y.o. male with evidence of a chronically obstructed right kidney from a 13 mm distal ureteral calculus and microscopic hematuria.  No pain at all.  Cystoscopic attempt at ureteral stent placement unsuccessful due to complete ureteral obstruction by the calculus.  Here today for right nephrostomy tube placement.  Past Medical History:  Diagnosis Date  . CAD (coronary artery disease)   . History of kidney stones   . Hyperlipemia   . Hypertension   . MI (myocardial infarction) [facility])     Past Surgical History:  Procedure Laterality Date  . CARDIAC CATHETERIZATION    . CORONARY ARTERY BYPASS GRAFT  2004   5 vessel  . CYSTOSCOPY/RETROGRADE/URETEROSCOPY Left 02/08/2018   Procedure: CYSTOSCOPY/RETROGRADE/URETEROSCOPY;  Surgeon: Vanna Scotland, MD;  Location: [facility] ORS;  Service: Urology;  Laterality: Left;    Allergies: Doxycycline hyclate  Medications: Prior to Admission medications   Medication Sig Start Date End Date Taking? Authorizing Provider  ezetimibe (ZETIA) 10 MG tablet Take 10 mg by mouth once daily at night 03/20/17  Yes [provider]  losartan (COZAAR) 50 MG tablet Take 50 mg by mouth at bedtime.  12/27/17  Yes [provider]  Multiple Vitamin (MULTI-VITAMINS) TABS Take 1 tablet by mouth at bedtime.    Yes [provider]  rosuvastatin (CRESTOR) 40 MG tablet Take 40 mg by mouth at bedtime.  11/25/17  Yes [provider]  sulfamethoxazole-trimethoprim (BACTRIM DS,SEPTRA DS) 800-160 MG tablet Take 1 tablet by mouth 2 (two) times daily. 02/08/18  Yes Vanna Scotland, MD  aspirin EC 81 MG tablet Take 81 mg by mouth at bedtime.     [provider]  oxyCODONE-acetaminophen (PERCOCET)  5-325 MG tablet Take 1-2 tablets by mouth every 4 (four) hours as needed for moderate pain or severe pain. Patient not taking: Reported on 02/11/2018 02/08/18   Vanna Scotland, MD     Family History  Problem Relation Age of Onset  . Heart failure Mother   . Lung cancer Mother     Social History   Socioeconomic History  . Marital status: Married    Spouse name: Not on file  . Number of children: Not on file  . Years of education: Not on file  . Highest education level: Not on file  Occupational History  . Not on file  Social Needs  . Financial resource strain: Not on file  . Food insecurity:    Worry: Not on file    Inability: Not on file  . Transportation needs:    Medical: Not on file    Non-medical: Not on file  Tobacco Use  . Smoking status: Never Smoker  . Smokeless tobacco: Never Used  Substance and Sexual Activity  . Alcohol use: Yes    Alcohol/week: 1.2 oz    Types: 2 Cans of beer per week  . Drug use: Never  . Sexual activity: Not on file  Lifestyle  . Physical activity:    Days per week: Not on file    Minutes per session: Not on file  . Stress: Not on file  Relationships  . Social connections:    Talks on phone: Not on file    Gets together: Not on file    Attends religious service: Not on file  Active member of club or organization: Not on file    Attends meetings of clubs or organizations: Not on file    Relationship status: Not on file  Other Topics Concern  . Not on file  Social History Narrative  . Not on file    Review of Systems: A 12 point ROS discussed and pertinent positives are indicated in the HPI above.  All other systems are negative.  Review of Systems  Constitutional: Negative.   Respiratory: Negative.   Cardiovascular: Negative.   Gastrointestinal: Negative.   Genitourinary: Negative.   Musculoskeletal: Negative.   Neurological: Negative.     Vital Signs: BP 128/86   Pulse 66   Temp 98.6 F (37 C) (Oral)   Resp (!)  24   SpO2 95%   Physical Exam  Constitutional: He is oriented to person, place, and time. He appears well-developed and well-nourished. No distress.  HENT:  Head: Normocephalic and atraumatic.  Neck: Neck supple. No JVD present.  Cardiovascular: Normal rate, regular rhythm and normal heart sounds. Exam reveals no gallop and no friction rub.  No murmur heard. Pulmonary/Chest: Effort normal and breath sounds normal. No stridor. No respiratory distress. He has no wheezes. He has no rales.  Abdominal: Soft. Bowel sounds are normal. He exhibits no distension and no mass. There is no tenderness. There is no rebound and no guarding.  Musculoskeletal: He exhibits no edema.  Lymphadenopathy:    He has no cervical adenopathy.  Neurological: He is alert and oriented to person, place, and time.  Skin: Skin is warm and dry. He is not diaphoretic.  Vitals reviewed.   Imaging: Ct Hematuria Workup  Result Date: 01/22/2018 CLINICAL DATA:  67 year old male with history of severe right hydroureteronephrosis. EXAM: CT ABDOMEN AND PELVIS WITHOUT AND WITH CONTRAST TECHNIQUE: Multidetector CT imaging of the abdomen and pelvis was performed following the standard protocol before and following the bolus administration of intravenous contrast. CONTRAST:  ISOVUE-300 IOPAMIDOL (ISOVUE-300) INJECTION 61% COMPARISON:  None. FINDINGS: Lower chest: Atherosclerotic calcifications in the left circumflex and right coronary arteries. Status post median sternotomy for CABG. Hepatobiliary: No suspicious cystic or solid hepatic lesions. No intra or extrahepatic biliary ductal dilatation. Gallbladder is normal in appearance. Pancreas: No pancreatic mass. No pancreatic ductal dilatation. No pancreatic or peripancreatic fluid or inflammatory changes. Spleen: Unremarkable. Adrenals/Urinary Tract: Several nonobstructive calculi are noted in the collecting system of the left kidney, largest of which measures 11 mm in the upper pole.  In the distal third of the right ureter there is a large calculus measuring 13 mm in diameter (axial image 67 of series 2). This is associated with severe proximal hydroureteronephrosis. Some moderate cortical atrophy is also noted in the right kidney. Right kidney is otherwise unremarkable in appearance. Subcentimeter low-attenuation lesion in the anterior aspect of the left kidney. No other suspicious renal lesions. No left-sided hydroureteronephrosis. Urinary bladder is normal in appearance. Bilateral adrenal glands are normal in appearance. Stomach/Bowel: Normal appearance of the stomach no pathologic dilatation of small bowel or colon. Normal appendix. Vascular/Lymphatic: Aortic atherosclerosis, without evidence of aneurysm or dissection in the abdominal or pelvic vasculature. No lymphadenopathy noted in the abdomen or pelvis. Reproductive: Prostate gland and seminal vesicles are unremarkable in appearance. Other: No significant volume of ascites.  No pneumoperitoneum. Musculoskeletal: There are no aggressive appearing lytic or blastic lesions noted in the visualized portions of the skeleton. IMPRESSION: 1. 13 mm obstructive calculus in the distal third of the right ureter with severe proximal  right hydroureteronephrosis and moderate right cortical atrophy. 2. Multiple additional nonobstructive left sided renal calculi in the collecting system measuring up to 11 mm in the upper pole. No left ureteral stones. 3. Aortic atherosclerosis, in addition to at least 2 vessel coronary artery disease. Please note that although the presence of coronary artery calcium documents the presence of coronary artery disease, the severity of this disease and any potential stenosis cannot be assessed on this non-gated CT examination. Assessment for potential risk factor modification, dietary therapy or pharmacologic therapy may be warranted, if clinically indicated. 4. Additional incidental findings, as above. Electronically Signed    By: Trudie Reedaniel  Entrikin M.D.   On: 01/22/2018 09:40    Labs:  CBC: Recent Labs    02/02/18 0817 02/10/18 0937 02/11/18 1301  WBC 4.7 6.7 5.9  HGB 15.3 14.4 14.4  HCT 44.8 42.1 41.9  PLT 212 198 201    COAGS: Recent Labs    02/11/18 1301  INR 1.07    BMP: Recent Labs    01/12/18 1520 02/02/18 0817 02/10/18 0937 02/11/18 1301  NA  --  139 138 137  K  --  4.7 3.9 4.0  CL  --  102 104 105  CO2  --  27 29 23   GLUCOSE  --  102* 109* 101*  BUN 22 20 25* 27*  CALCIUM  --  9.5 9.3 9.0  CREATININE 1.43* 1.18 1.58* 1.53*  GFRNONAA 51* >60 45* 46*  GFRAA 59* >60 52* 54*    LIVER FUNCTION TESTS: Recent Labs    02/10/18 0937  BILITOT 0.7  AST 27  ALT 26  ALKPHOS 34*  PROT 8.0  ALBUMIN 4.5    Assessment and Plan:  For right percutaneous nephrostomy tube placement today. Risks and benefits of the procedure were discussed with the patient including, but not limited to, infection, bleeding, significant bleeding or damage to adjacent structures.  All of the patient's questions were answered, patient is agreeable to proceed. Consent signed and in chart.  Patient will follow up with Dr. Apolinar JunesBrandon after PCN placement for further management.  Thank you for this interesting consult.  I greatly enjoyed meeting Robert Fields and look forward to participating in their care.  A copy of this report was sent to the requesting provider on this date.  Electronically Signed: Reola CalkinsYAMAGATA,Shawndell Schillaci T, MD 02/11/2018, 2:18 PM     I spent a total of 30 Minutes in face to face in clinical consultation, greater than 50% of which was counseling/coordinating care for right percutaneous nephrostomy tube placement.

## 2018-02-11 NOTE — Procedures (Signed)
Interventional Radiology Procedure Note  Procedure: Right percutaneous nephrostomy   Complications: None  Estimated Blood Loss: < 10 mL  Findings: 10 Fr PCN placed in right renal collecting system. Attached to gravity bag drainage.  Jodi MarbleGlenn T. Fredia SorrowYamagata, M.D Pager:  [phone]

## 2018-02-21 ENCOUNTER — Emergency Department
Admission: EM | Admit: 2018-02-21 | Discharge: 2018-02-21 | Disposition: A | Discharge status: Home / Self Care | Payer: Medicare Other | Attending: Emergency Medicine | Admitting: Emergency Medicine

## 2018-02-21 ENCOUNTER — Other Ambulatory Visit: Payer: Self-pay

## 2018-02-21 ENCOUNTER — Encounter: Payer: Self-pay | Admitting: Emergency Medicine

## 2018-02-21 DIAGNOSIS — R300 Dysuria: Secondary | ICD-10-CM | POA: Diagnosis present

## 2018-02-21 DIAGNOSIS — I10 Essential (primary) hypertension: Secondary | ICD-10-CM | POA: Insufficient documentation

## 2018-02-21 DIAGNOSIS — I252 Old myocardial infarction: Secondary | ICD-10-CM | POA: Insufficient documentation

## 2018-02-21 DIAGNOSIS — I2510 Atherosclerotic heart disease of native coronary artery without angina pectoris: Secondary | ICD-10-CM | POA: Diagnosis not present

## 2018-02-21 DIAGNOSIS — N3001 Acute cystitis with hematuria: Secondary | ICD-10-CM | POA: Diagnosis not present

## 2018-02-21 DIAGNOSIS — Z79899 Other long term (current) drug therapy: Secondary | ICD-10-CM | POA: Diagnosis not present

## 2018-02-21 DIAGNOSIS — Z7982 Long term (current) use of aspirin: Secondary | ICD-10-CM | POA: Diagnosis not present

## 2018-02-21 DIAGNOSIS — Z951 Presence of aortocoronary bypass graft: Secondary | ICD-10-CM | POA: Insufficient documentation

## 2018-02-21 LAB — BASIC METABOLIC PANEL
ANION GAP: 12 (ref 5–15)
BUN: 19 mg/dL (ref 8–23)
CHLORIDE: 96 mmol/L — AB (ref 98–111)
CO2: 26 mmol/L (ref 22–32)
Calcium: 9.2 mg/dL (ref 8.9–10.3)
Creatinine, Ser: 1.29 mg/dL — ABNORMAL HIGH (ref 0.61–1.24)
GFR calc Af Amer: >60 mL/min (ref >60)
GFR, EST NON AFRICAN AMERICAN: 57 mL/min — AB (ref >60)
GLUCOSE: 121 mg/dL — AB (ref 70–99)
POTASSIUM: 3.9 mmol/L (ref 3.5–5.1)
Sodium: 134 mmol/L — ABNORMAL LOW (ref 135–145)

## 2018-02-21 LAB — URINALYSIS, COMPLETE (UACMP) WITH MICROSCOPIC
BILIRUBIN URINE: NEGATIVE
Glucose, UA: NEGATIVE mg/dL
Ketones, ur: 20 mg/dL — AB
Nitrite: POSITIVE — AB
PROTEIN: 30 mg/dL — AB
Specific Gravity, Urine: 1.014 (ref 1.005–1.030)
WBC, UA: >50 WBC/hpf — ABNORMAL HIGH (ref 0–5)
pH: 6 (ref 5.0–8.0)

## 2018-02-21 LAB — CBC WITH DIFFERENTIAL/PLATELET
BASOS ABS: 0.1 10*3/uL (ref 0–0.1)
Basophils Relative: 1 %
Eosinophils Absolute: 0 10*3/uL (ref 0–0.7)
Eosinophils Relative: 0 %
HEMATOCRIT: 42 % (ref 40.0–52.0)
HEMOGLOBIN: 14.5 g/dL (ref 13.0–18.0)
LYMPHS PCT: 5 %
Lymphs Abs: 0.6 10*3/uL — ABNORMAL LOW (ref 1.0–3.6)
MCH: 31.3 pg (ref 26.0–34.0)
MCHC: 34.5 g/dL (ref 32.0–36.0)
MCV: 90.9 fL (ref 80.0–100.0)
Monocytes Absolute: 0.4 10*3/uL (ref 0.2–1.0)
Monocytes Relative: 3 %
NEUTROS PCT: 91 %
Neutro Abs: 11.1 10*3/uL — ABNORMAL HIGH (ref 1.4–6.5)
PLATELETS: 190 10*3/uL (ref 150–440)
RBC: 4.62 MIL/uL (ref 4.40–5.90)
RDW: 13 % (ref 11.5–14.5)
WBC: 12.2 10*3/uL — AB (ref 3.8–10.6)

## 2018-02-21 MED ORDER — SODIUM CHLORIDE 0.9 % IV BOLUS
1000.0000 mL | Freq: Once | INTRAVENOUS | Status: AC
  Administered 2018-02-21: 1000 mL via INTRAVENOUS

## 2018-02-21 MED ORDER — SODIUM CHLORIDE 0.9 % IV SOLN
1.0000 g | INTRAVENOUS | Status: DC
  Administered 2018-02-21: 1 g via INTRAVENOUS
  Filled 2018-02-21: qty 10

## 2018-02-21 MED ORDER — CEPHALEXIN 500 MG PO CAPS: 500.0000 mg | ORAL_CAPSULE | Freq: Three times a day (TID) | ORAL | 0 refills | Status: AC

## 2018-02-21 NOTE — ED Triage Notes (Signed)
Pt arrives ambulatory to triage with c/o possible UTI. Pt reports that he had a drainage placed on his right kidney x 1 week ago. Pt reports no issues with drainage but states that when he urinates he is feeling a burning sensation along with frequency. Pt also states that he had a temperature of 102.3 at home but did take tylenol at 1845. Pt is in NAD.

## 2018-02-21 NOTE — ED Notes (Signed)
Pt states he had a fever today, took tylenol. Pt states he has burning with urination.

## 2018-02-21 NOTE — ED Provider Notes (Signed)
[facility] Emergency Department Provider Note  ____________________________________________   First MD Initiated Contact with Patient 02/21/18 2013     (approximate)  I have reviewed the triage vital signs and the nursing notes.   HISTORY  Chief Complaint Urinary Frequency   HPI Robert Fields is a 67 y.o. male who presents to the emergency department for treatment of dysuria. He had a 13 mm kidney stone that they have been unable to get a stent around. He finished Bactrim several days ago.  He had a nephrostomy tube inserted approximately 1 week ago.  For the past day or so he has had an increase in urinary frequency and dysuria.  He had a fever of 102.3 at home but took some Tylenol and believes that it has since come down.   Past Medical History:  Diagnosis Date  . CAD (coronary artery disease)   . History of kidney stones   . Hyperlipemia   . Hypertension   . MI (myocardial infarction) [facility])     Patient Active Problem List   Diagnosis Date Noted  . Coronary artery disease 01/11/2018  . Hyperlipidemia 01/11/2018  . Hydronephrosis of right kidney 12/24/2017  . Persistent cough 01/19/2017  . Peripheral vascular disease (HCC) 10/17/2015  . Essential hypertension 04/17/2014  . Hematuria, microscopic 04/17/2014    Past Surgical History:  Procedure Laterality Date  . CARDIAC CATHETERIZATION    . CORONARY ARTERY BYPASS GRAFT  2004   5 vessel  . CYSTOSCOPY/RETROGRADE/URETEROSCOPY Left 02/08/2018   Procedure: CYSTOSCOPY/RETROGRADE/URETEROSCOPY;  Surgeon: Vanna Scotland, MD;  Location: [facility] ORS;  Service: Urology;  Laterality: Left;  . IR NEPHROSTOMY PLACEMENT RIGHT  02/11/2018    Prior to Admission medications   Medication Sig Start Date End Date Taking? Authorizing Provider  aspirin EC 81 MG tablet Take 81 mg by mouth at bedtime.     [provider]  cephALEXin (KEFLEX) 500 MG capsule Take 1 capsule (500 mg total) by mouth 3 (three)  times daily for 10 days. 02/21/18 03/03/18  Abbott Jasinski, Rulon Eisenmenger B, FNP  ezetimibe (ZETIA) 10 MG tablet Take 10 mg by mouth once daily at night 03/20/17   [provider]  losartan (COZAAR) 50 MG tablet Take 50 mg by mouth at bedtime.  12/27/17   [provider]  Multiple Vitamin (MULTI-VITAMINS) TABS Take 1 tablet by mouth at bedtime.     [provider]  oxyCODONE-acetaminophen (PERCOCET) 5-325 MG tablet Take 1-2 tablets by mouth every 4 (four) hours as needed for moderate pain or severe pain. Patient not taking: Reported on 02/11/2018 02/08/18   Vanna Scotland, MD  rosuvastatin (CRESTOR) 40 MG tablet Take 40 mg by mouth at bedtime.  11/25/17   [provider]  sulfamethoxazole-trimethoprim (BACTRIM DS,SEPTRA DS) 800-160 MG tablet Take 1 tablet by mouth 2 (two) times daily. 02/08/18   Vanna Scotland, MD    Allergies Doxycycline hyclate  Family History  Problem Relation Age of Onset  . Heart failure Mother   . Lung cancer Mother     Social History Social History   Tobacco Use  . Smoking status: Never Smoker  . Smokeless tobacco: Never Used  Substance Use Topics  . Alcohol use: Yes    Alcohol/week: 1.2 oz    Types: 2 Cans of beer per week  . Drug use: Never    Review of Systems  Constitutional: Positive for fever/chills Eyes: No visual changes. ENT: No sore throat. Cardiovascular: Denies chest pain. Respiratory: Denies shortness of breath.  Gastrointestinal: No abdominal pain.  No nausea, no vomiting.  Genitourinary: Positive for dysuria. Musculoskeletal: Negative for flank pain Skin: Negative for rash. Neurological: Negative for headaches, focal weakness or numbness.  ____________________________________________   PHYSICAL EXAM:  VITAL SIGNS: ED Triage Vitals [02/21/18 1932]  Enc Vitals Group     BP (!) 163/133     Pulse Rate 89     Resp 18     Temp 98.7 F (37.1 C)     Temp Source Oral     SpO2 96 %     Weight 230 lb (104.3 kg)      Height 6\' 1"  (1.854 m)     Head Circumference      Peak Flow      Pain Score 0     Pain Loc      Pain Edu?      Excl. in GC?     Constitutional: Alert and oriented. Well appearing and in no acute distress. Eyes: Conjunctivae are normal. PERRL. EOMI. Head: Atraumatic. Nose: No congestion/rhinnorhea. Mouth/Throat: Mucous membranes are moist.  Oropharynx non-erythematous. Neck: No stridor.   Cardiovascular: Normal rate, regular rhythm. Grossly normal heart sounds.  Good peripheral circulation. Respiratory: Normal respiratory effort.  No retractions. Lungs CTAB. Gastrointestinal: Soft and nontender. No distention. No abdominal bruits. No CVA tenderness. Musculoskeletal: No lower extremity tenderness nor edema.  No joint effusions. Neurologic:  Normal speech and language. No gross focal neurologic deficits are appreciated. No gait instability. Skin:  Skin is warm, dry and intact. No rash noted. Nephrostomy site clean and dry without surrounding erythema or drainage. Psychiatric: Mood and affect are normal. Speech and behavior are normal.  ____________________________________________   LABS (all labs ordered are listed, but only abnormal results are displayed)  Labs Reviewed  URINALYSIS, COMPLETE (UACMP) WITH MICROSCOPIC - Abnormal; Notable for the following components:      Result Value   Color, Urine AMBER (*)    APPearance CLOUDY (*)    Hgb urine dipstick MODERATE (*)    Ketones, ur 20 (*)    Protein, ur 30 (*)    Nitrite POSITIVE (*)    Leukocytes, UA LARGE (*)    WBC, UA >50 (*)    Bacteria, UA RARE (*)    Non Squamous Epithelial PRESENT (*)    All other components within normal limits  CBC WITH DIFFERENTIAL/PLATELET - Abnormal; Notable for the following components:   WBC 12.2 (*)    Neutro Abs 11.1 (*)    Lymphs Abs 0.6 (*)    All other components within normal limits  BASIC METABOLIC PANEL - Abnormal; Notable for the following components:   Sodium 134 (*)    Chloride  96 (*)    Glucose, Bld 121 (*)    Creatinine, Ser 1.29 (*)    GFR calc non Af Amer 57 (*)    All other components within normal limits  URINE CULTURE   ____________________________________________  EKG  Not indicated. ____________________________________________  RADIOLOGY  ED MD interpretation:  Not indicated.  Official radiology report(s): No results found.  ____________________________________________   PROCEDURES  Procedure(s) performed: None  Procedures  Critical Care performed: No  ____________________________________________   INITIAL IMPRESSION / ASSESSMENT AND PLAN / ED COURSE  As part of my medical decision making, I reviewed the following data within the electronic MEDICAL RECORD NUMBER Notes from prior ED visits   67 year old male presenting to the emergency department for evaluation and treatment of dysuria. No clinical indication of pyelonephritis.  Will check labs and discuss with urology.  ----------------------------------------- 10:40 PM on 02/21/2018 -----------------------------------------  Discussed with urology on call who does not recommend imaging. He recommends IV Rocephin and if clinically stable to discharge home on antibiotics then follow up with Dr. Apolinar JunesBrandon. This was discussed with the patient who feels this plan is reasonable. He declines pain medications. He was encouraged to take tylenol if needed for fever. He was given strict ER return precautions.      ____________________________________________   FINAL CLINICAL IMPRESSION(S) / ED DIAGNOSES  Final diagnoses:  Acute cystitis with hematuria     ED Discharge Orders        Ordered    cephALEXin (KEFLEX) 500 MG capsule  3 times daily     02/21/18 2308       Note:  This document was prepared using Dragon voice recognition software and may include unintentional dictation errors.    Chinita Pesterriplett, Clint Strupp B, FNP 02/22/18 Merrily [id number]    Phineas SemenGoodman, Graydon, MD 02/22/18 [phone]

## 2018-02-21 NOTE — ED Notes (Signed)
Tube placement site appears clean dry and WDL. Pt denies issues with it at this time.

## 2018-02-22 ENCOUNTER — Telehealth: Payer: Self-pay

## 2018-02-22 NOTE — Telephone Encounter (Signed)
Pt contacted office this afternoon stating his temp went up to 99.9 and then to 100.4. Pt denies any discomfort or illness. He states he has noticed small traces of blood in his catheter. Per Dr. Sherron MondayMacDiarmid, pt does not need t ogo back to ed at this time. Monitor symptoms and temperature. If worsens then go back to ed. Will call and check on pt in the morning.

## 2018-02-22 NOTE — Telephone Encounter (Signed)
Pt called stating he was in the emergency room due to urinary complications and running a fever. Pt was started on IV abx and then sent home and instructed to start taking oral abx. He was instructed to call and let our office know. He is doing much better today and will proceed with abx and follow-up appointment.

## 2018-02-23 ENCOUNTER — Telehealth: Payer: Self-pay

## 2018-02-23 NOTE — Telephone Encounter (Signed)
I called and spoke w/ pt, he states he is doing much better today. He denies a fever and states he is feeling better. Has not seen any blood today and feels like the pressure has gotten better.

## 2018-02-24 LAB — URINE CULTURE

## 2018-03-02 ENCOUNTER — Telehealth: Payer: Self-pay | Admitting: Urology

## 2018-03-02 NOTE — Telephone Encounter (Signed)
Pt called office to inform that he has oozing from his nephrostomy tube site. Denies fever, chills, Denies that the site is red, swollen or hot to touch, just clear oozing. Pt currently on antibiotics for UTI.  Please advise pt, he also has upcoming appt on Friday.

## 2018-03-04 ENCOUNTER — Encounter
Admission: RE | Admit: 2018-03-04 | Discharge: 2018-03-04 | Disposition: A | Discharge status: Home / Self Care | Payer: Medicare Other | Source: Ambulatory Visit | Attending: Urology | Admitting: Urology

## 2018-03-04 DIAGNOSIS — N132 Hydronephrosis with renal and ureteral calculous obstruction: Secondary | ICD-10-CM | POA: Insufficient documentation

## 2018-03-04 MED ORDER — TECHNETIUM TC 99M MERTIATIDE
5.4200 | Freq: Once | INTRAVENOUS | Status: AC | PRN
  Administered 2018-03-04: 5.42 via INTRAVENOUS

## 2018-03-04 MED ORDER — FUROSEMIDE 10 MG/ML IJ SOLN
52.1500 mg | Freq: Once | INTRAMUSCULAR | Status: AC
  Administered 2018-03-04: 52.15 mg via INTRAVENOUS
  Filled 2018-03-04: qty 5.2

## 2018-03-04 NOTE — Telephone Encounter (Signed)
Spoke with patient and notified this is normal as long as he does not develop fever chills or puss. Patient will keep follow up tomorrow.

## 2018-03-04 NOTE — Telephone Encounter (Signed)
Please advise 

## 2018-03-05 ENCOUNTER — Other Ambulatory Visit: Payer: Self-pay | Admitting: Radiology

## 2018-03-05 ENCOUNTER — Ambulatory Visit: Payer: Medicare Other | Admitting: Urology

## 2018-03-05 ENCOUNTER — Encounter: Payer: Self-pay | Admitting: Urology

## 2018-03-05 VITALS — BP 130/86 | HR 65 | Ht 73.0 in | Wt 224.2 lb

## 2018-03-05 DIAGNOSIS — N1330 Unspecified hydronephrosis: Secondary | ICD-10-CM | POA: Diagnosis not present

## 2018-03-05 DIAGNOSIS — N201 Calculus of ureter: Secondary | ICD-10-CM

## 2018-03-05 DIAGNOSIS — N132 Hydronephrosis with renal and ureteral calculous obstruction: Secondary | ICD-10-CM

## 2018-03-05 DIAGNOSIS — N261 Atrophy of kidney (terminal): Secondary | ICD-10-CM

## 2018-03-08 NOTE — Progress Notes (Signed)
 03/05/2018 3:32 PM   Robert Fields 11/15/1950 [id number]  Referring provider: Klein, Bert J III, MD [address] [facility] , [place] [address]  Chief Complaint  Patient presents with  . Follow-up    HPI: 67-year-old male with obstructing 13 mm right ureteral calculus at the level of the iliacs with severe hydroureteronephrosis and atrophy he was taken to the operating room on 02/08/2018 stone.  Unfortunately, the stone was heavily impacted with high-grade obstruction and a wire was unable to be placed in a retrograde fashion.  As such, he underwent percutaneous nephrostomy tube placement several days after the procedure for urinary decompression.  Follows up today with a Lasix renogram indicating decreased function of his right kidney at 26.5% compared to 73% on the left for split function.  He was seen and evaluated in the emergency room on 02/21/2018 with fevers to 102.  He ultimately grew E. Coli sensitive to flex and appropriately treated.  His fever subsided.  Today, he is concerned about some drainage around the tube.  He is concerned that his tube is also being pulled slightly.  He denies any flank pain.  No further fevers or chills.  He is tolerating the tube without difficulty.   PMH: Past Medical History:  Diagnosis Date  . CAD (coronary artery disease)   . History of kidney stones   . Hyperlipemia   . Hypertension   . MI (myocardial infarction) (HCC)     Surgical History: Past Surgical History:  Procedure Laterality Date  . CARDIAC CATHETERIZATION    . CORONARY ARTERY BYPASS GRAFT  2004   5 vessel  . CYSTOSCOPY/RETROGRADE/URETEROSCOPY Left 02/08/2018   Procedure: CYSTOSCOPY/RETROGRADE/URETEROSCOPY;  Surgeon: Deneka Greenwalt, MD;  Location: [facility] ORS;  Service: Urology;  Laterality: Left;  . IR NEPHROSTOMY PLACEMENT RIGHT  02/11/2018    Home Medications:  Allergies as of 03/05/2018      Reactions   Doxycycline Hyclate Rash        Medication List        Accurate as of 03/05/18 11:59 PM. Always use your most recent med list.          aspirin EC 81 MG tablet Take 81 mg by mouth at bedtime.   ezetimibe 10 MG tablet Commonly known as:  ZETIA Take 10 mg by mouth once daily at night   losartan 50 MG tablet Commonly known as:  COZAAR Take 50 mg by mouth at bedtime.   MULTI-VITAMINS Tabs Take 1 tablet by mouth at bedtime.   rosuvastatin 40 MG tablet Commonly known as:  CRESTOR Take 40 mg by mouth at bedtime.       Allergies:  Allergies  Allergen Reactions  . Doxycycline Hyclate Rash    Family History: Family History  Problem Relation Age of Onset  . Heart failure Mother   . Lung cancer Mother     Social History:  reports that he has never smoked. He has never used smokeless tobacco. He reports that he drinks about 2.0 standard drinks of alcohol per week. He reports that he does not use drugs.  ROS: UROLOGY Frequent Urination?: No Hard to postpone urination?: No Burning/pain with urination?: No Get up at night to urinate?: Yes Leakage of urine?: No Urine stream starts and stops?: No Trouble starting stream?: No Do you have to strain to urinate?: No Blood in urine?: No Urinary tract infection?: No Sexually transmitted disease?: No Injury to kidneys or bladder?: No Painful intercourse?: No Weak stream?: No   Erection problems?: No Penile pain?: No  Gastrointestinal Nausea?: No Vomiting?: No Indigestion/heartburn?: No Diarrhea?: No Constipation?: No  Constitutional Fever: No Night sweats?: No Weight loss?: No Fatigue?: No  Skin Skin rash/lesions?: No Itching?: No  Eyes Blurred vision?: No Double vision?: No  Ears/Nose/Throat Sore throat?: No Sinus problems?: No  Hematologic/Lymphatic Swollen glands?: No Easy bruising?: No  Cardiovascular Leg swelling?: No Chest pain?: No  Respiratory Cough?: No Shortness of breath?: No  Endocrine Excessive thirst?:  No  Musculoskeletal Back pain?: No Joint pain?: No  Neurological Headaches?: No Dizziness?: No  Psychologic Depression?: No Anxiety?: No  Physical Exam: BP 130/86 (BP Location: Left Arm, Patient Position: Sitting, Cuff Size: Normal)   Pulse 65   Ht 6' 1" (1.854 m)   Wt 224 lb 3.2 oz (101.7 kg)   BMI 29.58 kg/m   Constitutional:  Alert and oriented, No acute distress.  Wife today. HEENT: [place] AT, moist mucus membranes.  Trachea midline, no masses. Cardiovascular: No clubbing, cyanosis, or edema. Respiratory: Normal respiratory effort, no increased work of breathing. GI: Abdomen is soft, nontender, nondistended, no abdominal masses GU: No CVA tenderness.  Right nephrostomy tube in place, dressing taken down and the site appears to be clean dry and intact with a suture in place without significant drainage or erythema.  Dressing replaced.  Nephrostomy tube is draining clear yellow urine.. Skin: No rashes, bruises or suspicious lesions. Neurologic: Grossly intact, no focal deficits, moving all 4 extremities. Psychiatric: Normal mood and affect.  Laboratory Data: Lab Results  Component Value Date   WBC 12.2 (H) 02/21/2018   HGB 14.5 02/21/2018   HCT 42.0 02/21/2018   MCV 90.9 02/21/2018   PLT 190 02/21/2018    Lab Results  Component Value Date   CREATININE 1.29 (H) 02/21/2018    Urinalysis N/a  Pertinent Imaging:. CLINICAL DATA:  Status post right nephrostomy tube placement 2 weeks ago for kidney stones.  EXAM: NUCLEAR MEDICINE RENAL SCAN WITH DIURETIC ADMINISTRATION  TECHNIQUE: Radionuclide angiographic and sequential renal images were obtained after intravenous injection of radiopharmaceutical. Imaging was continued during slow intravenous injection of Lasix approximately 15 minutes after the start of the examination.  RADIOPHARMACEUTICALS:  5.42 mCi Technetium-99m MAG3 IV  COMPARISON:  CT abdomen 01/22/2018  FINDINGS: Flow: There is normal perfusion  to the left kidney. Diminished perfusion to the smaller, hydronephrotic right kidney noted.  Left renogram: Normal cortical uptake, excretion and clearance of the radiopharmaceutical by the normal size and positioned left kidney.  Right renogram: Diminished cortical uptake by the hydronephrotic right kidney is identified. Delayed excretion of the radiopharmaceutical noted. Markedly diminished clearance of the radiopharmaceutical from the copious/dilated right renal collecting system.  Differential:  Left kidney = 73.5 %  Right kidney = 26.5 %  T1/2 post Lasix :  Left kidney = 19 min (note: There was are ready significant clearance of the radiopharmaceutical from the right renal collecting system at the time of Lasix administration).  Right kidney = never achieved  IMPRESSION: 1. Normally functioning left kidney. 2. Diminished perfusion, cortical uptake and clearance of the radiopharmaceutical by the smaller hydronephrotic right kidney compatible with partial obstruction. 3. Split renal function is 73.5% from the left kidney and 26.5% from the right kidney.   Electronically Signed   By: Taylor  Stroud M.D.   On: 03/05/2018 08:23  Renogram was personally reviewed today as well as with the patient and his wife  Assessment & Plan:    1. 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  If this fails, we will likely try retrograde ureteroscopy without a safety wire  although this is not preferred as it limits the opportunity to place a stent across any defect in the event of a ureteral injury.    We will arrange for attempted internalization of the stent as discussed.  That is successful, will return back to the operating room for a second attempt at retrograde ureteroscopy for treatment of the stone.  We discussed the risk of surgery as previously discussed.  Regardless, he has relatively high risk for development of ureteral stricture at the level of the impacted stone.  He understands this.  2. Right renal atrophy As above  3. Hydronephrosis of right kidney As above  Schedule surgery  Jakarius Flamenco, MD  [facility] [address], [address] [place], [place] [address] [phone]  I spent 25 min with this patient of which greater than 50% was spent in counseling and coordination of care with the patient.   

## 2018-03-08 NOTE — H&P (View-Only) (Signed)
03/05/2018 3:32 PM   Robert Fields [birth year] [id number]  Referring provider: Lynnea FerrierKlein, Bert J III, MD [address] [facility] [place] [place] [id number]  Chief Complaint  Patient presents with  . Follow-up    HPI: 67 year old male with obstructing 13 mm right ureteral calculus at the level of the iliacs with severe hydroureteronephrosis and atrophy he was taken to the operating room on 02/08/2018 stone.  Unfortunately, the stone was heavily impacted with high-grade obstruction and a wire was unable to be placed in a retrograde fashion.  As such, he underwent percutaneous nephrostomy tube placement several days after the procedure for urinary decompression.  Follows up today with a Lasix renogram indicating decreased function of his right kidney at 26.5% compared to 73% on the left for split function.  He was seen and evaluated in the emergency room on 02/21/2018 with fevers to 102.  He ultimately grew E. Coli sensitive to flex and appropriately treated.  His fever subsided.  Today, he is concerned about some drainage around the tube.  He is concerned that his tube is also being pulled slightly.  He denies any flank pain.  No further fevers or chills.  He is tolerating the tube without difficulty.   PMH: Past Medical History:  Diagnosis Date  . CAD (coronary artery disease)   . History of kidney stones   . Hyperlipemia   . Hypertension   . MI (myocardial infarction) [facility](HCC)     Surgical History: Past Surgical History:  Procedure Laterality Date  . CARDIAC CATHETERIZATION    . CORONARY ARTERY BYPASS GRAFT  2004   5 vessel  . CYSTOSCOPY/RETROGRADE/URETEROSCOPY Left 02/08/2018   Procedure: CYSTOSCOPY/RETROGRADE/URETEROSCOPY;  Surgeon: Vanna ScotlandBrandon, Casmer Yepiz, MD;  Location: [facility] ORS;  Service: Urology;  Laterality: Left;  . IR NEPHROSTOMY PLACEMENT RIGHT  02/11/2018    Home Medications:  Allergies as of 03/05/2018      Reactions   Doxycycline Hyclate Rash        Medication List        Accurate as of 03/05/18 11:59 PM. Always use your most recent med list.          aspirin EC 81 MG tablet Take 81 mg by mouth at bedtime.   ezetimibe 10 MG tablet Commonly known as:  ZETIA Take 10 mg by mouth once daily at night   losartan 50 MG tablet Commonly known as:  COZAAR Take 50 mg by mouth at bedtime.   MULTI-VITAMINS Tabs Take 1 tablet by mouth at bedtime.   rosuvastatin 40 MG tablet Commonly known as:  CRESTOR Take 40 mg by mouth at bedtime.       Allergies:  Allergies  Allergen Reactions  . Doxycycline Hyclate Rash    Family History: Family History  Problem Relation Age of Onset  . Heart failure Mother   . Lung cancer Mother     Social History:  reports that he has never smoked. He has never used smokeless tobacco. He reports that he drinks about 2.0 standard drinks of alcohol per week. He reports that he does not use drugs.  ROS: UROLOGY Frequent Urination?: No Hard to postpone urination?: No Burning/pain with urination?: No Get up at night to urinate?: Yes Leakage of urine?: No Urine stream starts and stops?: No Trouble starting stream?: No Do you have to strain to urinate?: No Blood in urine?: No Urinary tract infection?: No Sexually transmitted disease?: No Injury to kidneys or bladder?: No Painful intercourse?: No Weak stream?: No  Erection problems?: No Penile pain?: No  Gastrointestinal Nausea?: No Vomiting?: No Indigestion/heartburn?: No Diarrhea?: No Constipation?: No  Constitutional Fever: No Night sweats?: No Weight loss?: No Fatigue?: No  Skin Skin rash/lesions?: No Itching?: No  Eyes Blurred vision?: No Double vision?: No  Ears/Nose/Throat Sore throat?: No Sinus problems?: No  Hematologic/Lymphatic Swollen glands?: No Easy bruising?: No  Cardiovascular Leg swelling?: No Chest pain?: No  Respiratory Cough?: No Shortness of breath?: No  Endocrine Excessive thirst?:  No  Musculoskeletal Back pain?: No Joint pain?: No  Neurological Headaches?: No Dizziness?: No  Psychologic Depression?: No Anxiety?: No  Physical Exam: BP 130/86 (BP Location: Left Arm, Patient Position: Sitting, Cuff Size: Normal)   Pulse 65   Ht 6\' 1"  (1.854 m)   Wt 224 lb 3.2 oz (101.7 kg)   BMI 29.58 kg/m   Constitutional:  Alert and oriented, No acute distress.  Wife today. HEENT: [place] AT, moist mucus membranes.  Trachea midline, no masses. Cardiovascular: No clubbing, cyanosis, or edema. Respiratory: Normal respiratory effort, no increased work of breathing. GI: Abdomen is soft, nontender, nondistended, no abdominal masses GU: No CVA tenderness.  Right nephrostomy tube in place, dressing taken down and the site appears to be clean dry and intact with a suture in place without significant drainage or erythema.  Dressing replaced.  Nephrostomy tube is draining clear yellow urine.. Skin: No rashes, bruises or suspicious lesions. Neurologic: Grossly intact, no focal deficits, moving all 4 extremities. Psychiatric: Normal mood and affect.  Laboratory Data: Lab Results  Component Value Date   WBC 12.2 (H) 02/21/2018   HGB 14.5 02/21/2018   HCT 42.0 02/21/2018   MCV 90.9 02/21/2018   PLT 190 02/21/2018    Lab Results  Component Value Date   CREATININE 1.29 (H) 02/21/2018    Urinalysis N/a  Pertinent Imaging:. CLINICAL DATA:  Status post right nephrostomy tube placement 2 weeks ago for kidney stones.  EXAM: NUCLEAR MEDICINE RENAL SCAN WITH DIURETIC ADMINISTRATION  TECHNIQUE: Radionuclide angiographic and sequential renal images were obtained after intravenous injection of radiopharmaceutical. Imaging was continued during slow intravenous injection of Lasix approximately 15 minutes after the start of the examination.  RADIOPHARMACEUTICALS:  5.42 mCi Technetium-33m MAG3 IV  COMPARISON:  CT abdomen 01/22/2018  FINDINGS: Flow: There is normal perfusion  to the left kidney. Diminished perfusion to the smaller, hydronephrotic right kidney noted.  Left renogram: Normal cortical uptake, excretion and clearance of the radiopharmaceutical by the normal size and positioned left kidney.  Right renogram: Diminished cortical uptake by the hydronephrotic right kidney is identified. Delayed excretion of the radiopharmaceutical noted. Markedly diminished clearance of the radiopharmaceutical from the copious/dilated right renal collecting system.  Differential:  Left kidney = 73.5 %  Right kidney = 26.5 %  T1/2 post Lasix :  Left kidney = 19 min (note: There was are ready significant clearance of the radiopharmaceutical from the right renal collecting system at the time of Lasix administration).  Right kidney = never achieved  IMPRESSION: 1. Normally functioning left kidney. 2. Diminished perfusion, cortical uptake and clearance of the radiopharmaceutical by the smaller hydronephrotic right kidney compatible with partial obstruction. 3. Split renal function is 73.5% from the left kidney and 26.5% from the right kidney.   Electronically Signed   By: Signa Kell M.D.   On: 03/05/2018 08:23  Renogram was personally reviewed today as well as with the patient and his wife  Assessment & Plan:    1. 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  If this fails, we will likely try retrograde ureteroscopy without a safety wire  although this is not preferred as it limits the opportunity to place a stent across any defect in the event of a ureteral injury.    We will arrange for attempted internalization of the stent as discussed.  That is successful, will return back to the operating room for a second attempt at retrograde ureteroscopy for treatment of the stone.  We discussed the risk of surgery as previously discussed.  Regardless, he has relatively high risk for development of ureteral stricture at the level of the impacted stone.  He understands this.  2. Right renal atrophy As above  3. Hydronephrosis of right kidney As above  Schedule surgery  Vanna ScotlandAshley Shreyansh Tiffany, MD  [facility] [address], [address] [place], [place] [address] [phone]  I spent 25 min with this patient of which greater than 50% was spent in counseling and coordination of care with the patient.

## 2018-03-09 ENCOUNTER — Other Ambulatory Visit: Payer: Self-pay | Admitting: Radiology

## 2018-03-09 DIAGNOSIS — N201 Calculus of ureter: Secondary | ICD-10-CM

## 2018-03-16 ENCOUNTER — Other Ambulatory Visit: Payer: Self-pay | Admitting: Radiology

## 2018-03-16 ENCOUNTER — Other Ambulatory Visit: Payer: Self-pay | Admitting: Internal Medicine

## 2018-03-17 ENCOUNTER — Ambulatory Visit
Admission: RE | Admit: 2018-03-17 | Discharge: 2018-03-17 | Disposition: A | Discharge status: Home / Self Care | Payer: Medicare Other | Source: Ambulatory Visit | Attending: Urology | Admitting: Urology

## 2018-03-17 ENCOUNTER — Ambulatory Visit: Admission: RE | Admit: 2018-03-17 | Payer: Medicare Other | Source: Ambulatory Visit

## 2018-03-17 DIAGNOSIS — I10 Essential (primary) hypertension: Secondary | ICD-10-CM | POA: Diagnosis not present

## 2018-03-17 DIAGNOSIS — I252 Old myocardial infarction: Secondary | ICD-10-CM | POA: Diagnosis not present

## 2018-03-17 DIAGNOSIS — Z8249 Family history of ischemic heart disease and other diseases of the circulatory system: Secondary | ICD-10-CM | POA: Diagnosis not present

## 2018-03-17 DIAGNOSIS — E785 Hyperlipidemia, unspecified: Secondary | ICD-10-CM | POA: Insufficient documentation

## 2018-03-17 DIAGNOSIS — Z79899 Other long term (current) drug therapy: Secondary | ICD-10-CM | POA: Insufficient documentation

## 2018-03-17 DIAGNOSIS — N132 Hydronephrosis with renal and ureteral calculous obstruction: Secondary | ICD-10-CM | POA: Diagnosis present

## 2018-03-17 DIAGNOSIS — I2510 Atherosclerotic heart disease of native coronary artery without angina pectoris: Secondary | ICD-10-CM | POA: Diagnosis not present

## 2018-03-17 DIAGNOSIS — Z951 Presence of aortocoronary bypass graft: Secondary | ICD-10-CM | POA: Insufficient documentation

## 2018-03-17 DIAGNOSIS — Z7982 Long term (current) use of aspirin: Secondary | ICD-10-CM | POA: Insufficient documentation

## 2018-03-17 DIAGNOSIS — Z9889 Other specified postprocedural states: Secondary | ICD-10-CM | POA: Diagnosis not present

## 2018-03-17 DIAGNOSIS — Z466 Encounter for fitting and adjustment of urinary device: Secondary | ICD-10-CM | POA: Diagnosis not present

## 2018-03-17 DIAGNOSIS — N201 Calculus of ureter: Secondary | ICD-10-CM

## 2018-03-17 DIAGNOSIS — Z881 Allergy status to other antibiotic agents status: Secondary | ICD-10-CM | POA: Insufficient documentation

## 2018-03-17 HISTORY — PX: IR CONVERT RIGHT NEPHROSTOMY TO NEPHROURETERAL CATH: IMG6068

## 2018-03-17 LAB — BASIC METABOLIC PANEL
Anion gap: 9 (ref 5–15)
BUN: 15 mg/dL (ref 8–23)
CHLORIDE: 105 mmol/L (ref 98–111)
CO2: 26 mmol/L (ref 22–32)
Calcium: 9.1 mg/dL (ref 8.9–10.3)
Creatinine, Ser: 1.07 mg/dL (ref 0.61–1.24)
GFR calc non Af Amer: >60 mL/min (ref >60)
Glucose, Bld: 109 mg/dL — ABNORMAL HIGH (ref 70–99)
Potassium: 4.2 mmol/L (ref 3.5–5.1)
SODIUM: 140 mmol/L (ref 135–145)

## 2018-03-17 LAB — CBC
HEMATOCRIT: 40.3 % (ref 40.0–52.0)
Hemoglobin: 14 g/dL (ref 13.0–18.0)
MCH: 31.5 pg (ref 26.0–34.0)
MCHC: 34.8 g/dL (ref 32.0–36.0)
MCV: 90.6 fL (ref 80.0–100.0)
Platelets: 210 10*3/uL (ref 150–440)
RBC: 4.45 MIL/uL (ref 4.40–5.90)
RDW: 13.1 % (ref 11.5–14.5)
WBC: 5 10*3/uL (ref 3.8–10.6)

## 2018-03-17 LAB — PROTIME-INR
INR: 0.98
Prothrombin Time: 12.9 seconds (ref 11.4–15.2)

## 2018-03-17 MED ORDER — SODIUM CHLORIDE 0.9 % IV SOLN
INTRAVENOUS | Status: DC
  Administered 2018-03-17: 09:00:00 via INTRAVENOUS

## 2018-03-17 MED ORDER — FENTANYL CITRATE (PF) 100 MCG/2ML IJ SOLN
INTRAMUSCULAR | Status: DC | PRN
  Administered 2018-03-17: 50 ug via INTRAVENOUS

## 2018-03-17 MED ORDER — MIDAZOLAM HCL 2 MG/2ML IJ SOLN
INTRAMUSCULAR | Status: AC
  Filled 2018-03-17: qty 2

## 2018-03-17 MED ORDER — FENTANYL CITRATE (PF) 100 MCG/2ML IJ SOLN
INTRAMUSCULAR | Status: AC
  Filled 2018-03-17: qty 2

## 2018-03-17 MED ORDER — CEFAZOLIN SODIUM-DEXTROSE 2-4 GM/100ML-% IV SOLN
2.0000 g | Freq: Once | INTRAVENOUS | Status: AC
  Administered 2018-03-17: 2 g via INTRAVENOUS
  Filled 2018-03-17: qty 100

## 2018-03-17 MED ORDER — FENTANYL CITRATE (PF) 100 MCG/2ML IJ SOLN
INTRAMUSCULAR | Status: AC | PRN
  Administered 2018-03-17: 50 ug via INTRAVENOUS

## 2018-03-17 MED ORDER — CIPROFLOXACIN IN D5W 400 MG/200ML IV SOLN: 400.0000 mg | INTRAVENOUS | Status: DC

## 2018-03-17 MED ORDER — CIPROFLOXACIN IN D5W 400 MG/200ML IV SOLN
INTRAVENOUS | Status: AC
  Filled 2018-03-17: qty 200

## 2018-03-17 MED ORDER — IOPAMIDOL (ISOVUE-300) INJECTION 61%
30.0000 mL | Freq: Once | INTRAVENOUS | Status: AC | PRN
  Administered 2018-03-17: 30 mL

## 2018-03-17 MED ORDER — MIDAZOLAM HCL 2 MG/2ML IJ SOLN
INTRAMUSCULAR | Status: AC | PRN
  Administered 2018-03-17 (×2): 1 mg via INTRAVENOUS

## 2018-03-17 NOTE — Discharge Instructions (Signed)
Percutaneous Nephrostomy, Care After °This sheet gives you information about how to care for yourself after your procedure. Your health care provider may also give you more specific instructions. If you have problems or questions, contact your health care provider. °What can I expect after the procedure? °After the procedure, it is common to have: °· Some soreness where the nephrostomy tube was inserted (tube insertion site). °· Blood-tinged drainage from the nephrostomy tube for the first 24 hours. ° °Follow these instructions at home: °Activity °· Return to your normal activities as told by your health care provider. Ask your health care provider what activities are safe for you. °· Avoid activities that may cause the nephrostomy tubing to bend. °· Do not take baths, swim, or use a hot tub until your health care provider approves. Ask your health care provider if you can take showers. Cover the nephrostomy tube dressing with a watertight covering when you take a shower. °· Do not drive for 24 hours if you were given a medicine to help you relax (sedative). °Care of the tube insertion site °· Follow instructions from your health care provider about how to take care of your tube insertion site. Make sure you: °? Wash your hands with soap and water before you change your bandage (dressing). If soap and water are not available, use hand sanitizer. °? Change your dressing as told by your health care provider. Be careful not to pull on the tube while removing the dressing. °? When you change the dressing, wash the skin around the tube, rinse well, and pat the skin dry. °· Check the tube insertion area every day for signs of infection. Check for: °? More redness, swelling, or pain. °? More fluid or blood. °? Warmth. °? Pus or a bad smell. °Care of the nephrostomy tube and drainage bag °· Always keep the tubing, the leg bag, or the bedside drainage bags below the level of the kidney so that your urine drains  freely. °· When connecting your nephrostomy tube to a drainage bag, make sure that there are no kinks in the tubing and that your urine is draining freely. You may want to use an elastic bandage to wrap any exposed tubing that goes from the nephrostomy tube to any of the connecting tubes. °· At night, you may want to connect your nephrostomy tube or the leg bag to a larger bedside drainage bag. °· Follow instructions from your health care provider about how to empty or change the drainage bag. °· Empty the drainage bag when it becomes ? full. °· Replace the drainage bag and any extension tubing that is connected to your nephrostomy tube every 3 weeks or as often as told by your health care provider. Your health care provider will explain how to change the drainage bag and extension tubing. °General instructions °· Take over-the-counter and prescription medicines only as told by your health care provider. °· Keep all follow-up visits as told by your health care provider. This is important. °Contact a health care provider if: °· You have problems with any of the valves or tubing. °· You have persistent pain or soreness in your back. °· You have more redness, swelling, or pain around your tube insertion site. °· You have more fluid or blood coming from your tube insertion site. °· Your tube insertion site feels warm to the touch. °· You have pus or a bad smell coming from your tube insertion site. °· You have increased urine output or you feel   burning when urinating. °Get help right away if: °· You have pain in your abdomen during the first week. °· You have chest pain or have trouble breathing. °· You have a new appearance of blood in your urine. °· You have a fever or chills. °· You have back pain that is not relieved by your medicine. °· You have decreased urine output. °· Your nephrostomy tube comes out. °This information is not intended to replace advice given to you by your health care provider. Make sure you  discuss any questions you have with your health care provider. °Document Released: 02/28/2004 Document Revised: 04/18/2016 Document Reviewed: 04/18/2016 °Elsevier Interactive Patient Education © 2018 [employer] ° ° ° °Moderate Conscious Sedation, Adult, Care After °These instructions provide you with information about caring for yourself after your procedure. Your health care provider may also give you more specific instructions. Your treatment has been planned according to current medical practices, but problems sometimes occur. Call your health care provider if you have any problems or questions after your procedure. °What can I expect after the procedure? °After your procedure, it is common: °· To feel sleepy for several hours. °· To feel clumsy and have poor balance for several hours. °· To have poor judgment for several hours. °· To vomit if you eat too soon. ° °Follow these instructions at home: °For at least 24 hours after the procedure: ° °· Do not: °? Participate in activities where you could fall or become injured. °? Drive. °? Use heavy machinery. °? Drink alcohol. °? Take sleeping pills or medicines that cause drowsiness. °? Make important decisions or sign legal documents. °? Take care of children on your own. °· Rest. °Eating and drinking °· Follow the diet recommended by your health care provider. °· If you vomit: °? Drink water, juice, or soup when you can drink without vomiting. °? Make sure you have little or no nausea before eating solid foods. °General instructions °· Have a responsible adult stay with you until you are awake and alert. °· Take over-the-counter and prescription medicines only as told by your health care provider. °· If you smoke, do not smoke without supervision. °· Keep all follow-up visits as told by your health care provider. This is important. °Contact a health care provider if: °· You keep feeling nauseous or you keep vomiting. °· You feel light-headed. °· You develop a  rash. °· You have a fever. °Get help right away if: °· You have trouble breathing. °This information is not intended to replace advice given to you by your health care provider. Make sure you discuss any questions you have with your health care provider. °Document Released: 04/27/2013 Document Revised: 12/10/2015 Document Reviewed: 10/27/2015 °Elsevier Interactive Patient Education © 2018 [employer] ° °

## 2018-03-17 NOTE — Procedures (Signed)
Interventional Radiology Procedure Note  Procedure: Right ureteral stent placement  Complications: None  Estimated Blood Loss: < 10 mL  Findings: After removal of right PCN, catheter advanced into ureter.  Stable positioning of large calculus in distal right ureter causing ureteral obstruction.  Able to advance hydrophilic wire past calculus and eventually place an 8 Fr x 26 cm JJ ureteral stent extending from renal pelvis to bladder.  5 Fr Glidecath placed and advanced into ureter to secure access.  Catheter capped and secured at skin exit site.  Jodi MarbleGlenn T. Fredia SorrowYamagata, M.D Pager:  [phone]

## 2018-03-17 NOTE — H&P (Signed)
Chief Complaint: Patient was seen in consultation today for right ureteral stent placement at the request of Brandon,Ashley  Referring Physician(s): Brandon,Ashley  Patient Status: [facility] - Out-pt  History of Present Illness: Robert Fields is a 67 y.o. male known to our service and myself with chronically obstructed right kidney due to an impacted 13 mm calculus in pelvic ureter.  Retrograde stent placement unsuccessful.  Status post percutaneous nephrostomy by me on 02/11/18. Has tolerated PCN well. NM study showed 27% differential function in right kidney. Now here for attempt at antegrade ureteral stent placement.  Dr. Apolinar Junes is planning an antegrade ureteral stone manipulation/nephrolithotomy and extraction on 03/28/18.  Past Medical History:  Diagnosis Date  . CAD (coronary artery disease)   . History of kidney stones   . Hyperlipemia   . Hypertension   . MI (myocardial infarction) [facility])     Past Surgical History:  Procedure Laterality Date  . CARDIAC CATHETERIZATION    . CORONARY ARTERY BYPASS GRAFT  2004   5 vessel  . CYSTOSCOPY/RETROGRADE/URETEROSCOPY Left 02/08/2018   Procedure: CYSTOSCOPY/RETROGRADE/URETEROSCOPY;  Surgeon: Vanna Scotland, MD;  Location: [facility] ORS;  Service: Urology;  Laterality: Left;  . IR NEPHROSTOMY PLACEMENT RIGHT  02/11/2018    Allergies: Doxycycline hyclate  Medications: Prior to Admission medications   Medication Sig Start Date End Date Taking? Authorizing Provider  aspirin EC 81 MG tablet Take 81 mg by mouth at bedtime.    Yes [provider]  ezetimibe (ZETIA) 10 MG tablet Take 10 mg by mouth once daily at night 03/20/17  Yes [provider]  losartan (COZAAR) 50 MG tablet Take 50 mg by mouth at bedtime.  12/27/17  Yes [provider]  Multiple Vitamin (MULTI-VITAMINS) TABS Take 1 tablet by mouth at bedtime.    Yes [provider]  rosuvastatin (CRESTOR) 40 MG tablet Take 40 mg by mouth at bedtime.  11/25/17   Yes [provider]     Family History  Problem Relation Age of Onset  . Heart failure Mother   . Lung cancer Mother     Social History   Socioeconomic History  . Marital status: Married    Spouse name: Not on file  . Number of children: Not on file  . Years of education: Not on file  . Highest education level: Not on file  Occupational History  . Not on file  Social Needs  . Financial resource strain: Not on file  . Food insecurity:    Worry: Not on file    Inability: Not on file  . Transportation needs:    Medical: Not on file    Non-medical: Not on file  Tobacco Use  . Smoking status: Never Smoker  . Smokeless tobacco: Never Used  Substance and Sexual Activity  . Alcohol use: Yes    Alcohol/week: 2.0 standard drinks    Types: 2 Cans of beer per week  . Drug use: Never  . Sexual activity: Not on file  Lifestyle  . Physical activity:    Days per week: Not on file    Minutes per session: Not on file  . Stress: Not on file  Relationships  . Social connections:    Talks on phone: Not on file    Gets together: Not on file    Attends religious service: Not on file    Active member of club or organization: Not on file    Attends meetings of clubs or organizations: Not on file  Relationship status: Not on file  Other Topics Concern  . Not on file  Social History Narrative  . Not on file    Review of Systems: A 12 point ROS discussed and pertinent positives are indicated in the HPI above.  All other systems are negative.  Review of Systems  Constitutional: Negative.   Respiratory: Negative.   Cardiovascular: Negative.   Gastrointestinal: Negative.   Genitourinary: Negative.   Musculoskeletal: Negative.   Neurological: Negative.     Vital Signs: BP (!) 147/82 (BP Location: Right Arm)   Pulse 65   Temp 98.6 F (37 C) (Oral)   Resp 17   Ht 6\' 1"  (1.854 m)   Wt 102.1 kg   SpO2 93%   BMI 29.69 kg/m   Physical Exam  Constitutional: He is  oriented to person, place, and time. No distress.  Neck: Neck supple. No JVD present.  Cardiovascular: Normal rate, regular rhythm and normal heart sounds. Exam reveals no gallop and no friction rub.  No murmur heard. Pulmonary/Chest: Effort normal and breath sounds normal. No stridor. No respiratory distress. He has no wheezes. He has no rales.  Abdominal: Soft. Bowel sounds are normal. He exhibits no distension and no mass. There is no tenderness. There is no rebound and no guarding.  Musculoskeletal: He exhibits no edema.  Lymphadenopathy:    He has no cervical adenopathy.  Neurological: He is alert and oriented to person, place, and time.  Skin: Skin is warm and dry. He is not diaphoretic.  Vitals reviewed.   Imaging: Nm Renal Imaging Flow W/pharm  Result Date: 03/05/2018 CLINICAL DATA:  Status post right nephrostomy tube placement 2 weeks ago for kidney stones. EXAM: NUCLEAR MEDICINE RENAL SCAN WITH DIURETIC ADMINISTRATION TECHNIQUE: Radionuclide angiographic and sequential renal images were obtained after intravenous injection of radiopharmaceutical. Imaging was continued during slow intravenous injection of Lasix approximately 15 minutes after the start of the examination. RADIOPHARMACEUTICALS:  5.42 mCi Technetium-5928m MAG3 IV COMPARISON:  CT abdomen 01/22/2018 FINDINGS: Flow: There is normal perfusion to the left kidney. Diminished perfusion to the smaller, hydronephrotic right kidney noted. Left renogram: Normal cortical uptake, excretion and clearance of the radiopharmaceutical by the normal size and positioned left kidney. Right renogram: Diminished cortical uptake by the hydronephrotic right kidney is identified. Delayed excretion of the radiopharmaceutical noted. Markedly diminished clearance of the radiopharmaceutical from the copious/dilated right renal collecting system. Differential: Left kidney = 73.5 % Right kidney = 26.5 % T1/2 post Lasix : Left kidney = 19 min (note: There was  are ready significant clearance of the radiopharmaceutical from the right renal collecting system at the time of Lasix administration). Right kidney = never achieved IMPRESSION: 1. Normally functioning left kidney. 2. Diminished perfusion, cortical uptake and clearance of the radiopharmaceutical by the smaller hydronephrotic right kidney compatible with partial obstruction. 3. Split renal function is 73.5% from the left kidney and 26.5% from the right kidney. Electronically Signed   By: Signa Kellaylor  Stroud M.D.   On: 03/05/2018 08:23    Labs:  CBC: Recent Labs    02/10/18 0937 02/11/18 1301 02/21/18 2124 03/17/18 0843  WBC 6.7 5.9 12.2* 5.0  HGB 14.4 14.4 14.5 14.0  HCT 42.1 41.9 42.0 40.3  PLT 198 201 190 210    COAGS: Recent Labs    02/11/18 1301 03/17/18 0843  INR 1.07 0.98    BMP: Recent Labs    02/10/18 0937 02/11/18 1301 02/21/18 2124 03/17/18 0843  NA 138 137 134* 140  K  3.9 4.0 3.9 4.2  CL 104 105 96* 105  CO2 29 23 26 26   GLUCOSE 109* 101* 121* 109*  BUN 25* 27* 19 15  CALCIUM 9.3 9.0 9.2 9.1  CREATININE 1.58* 1.53* 1.29* 1.07  GFRNONAA 45* 46* 57* >60  GFRAA 52* 54* >60 >60    LIVER FUNCTION TESTS: Recent Labs    02/10/18 0937  BILITOT 0.7  AST 27  ALT 26  ALKPHOS 34*  PROT 8.0  ALBUMIN 4.5    Assessment and Plan:  For attempted placement of antegrade ureteral stent today.  If successful, will maintain renal access for operative procedure on 9/8.    Thank you for this interesting consult.  I greatly enjoyed meeting Robert Fields and look forward to participating in their care.  A copy of this report was sent to the requesting provider on this date.  Electronically Signed: Reola Calkins, MD 03/17/2018, 10:55 AM     I spent a total of 15 Minutes in face to face in clinical consultation, greater than 50% of which was counseling/coordinating care for ureteral stent placement.

## 2018-03-24 ENCOUNTER — Other Ambulatory Visit: Payer: Self-pay

## 2018-03-24 ENCOUNTER — Telehealth: Payer: Self-pay | Admitting: Radiology

## 2018-03-24 ENCOUNTER — Encounter
Admission: RE | Admit: 2018-03-24 | Discharge: 2018-03-24 | Disposition: A | Discharge status: Home / Self Care | Payer: Medicare Other | Source: Ambulatory Visit | Attending: Urology | Admitting: Urology

## 2018-03-24 DIAGNOSIS — N201 Calculus of ureter: Secondary | ICD-10-CM | POA: Diagnosis not present

## 2018-03-24 DIAGNOSIS — I10 Essential (primary) hypertension: Secondary | ICD-10-CM | POA: Diagnosis not present

## 2018-03-24 DIAGNOSIS — Z960 Presence of urogenital implants: Secondary | ICD-10-CM | POA: Diagnosis present

## 2018-03-24 LAB — URINALYSIS, ROUTINE W REFLEX MICROSCOPIC
Bacteria, UA: NONE SEEN
Bilirubin Urine: NEGATIVE
Glucose, UA: NEGATIVE mg/dL
Ketones, ur: NEGATIVE mg/dL
Nitrite: POSITIVE — AB
Protein, ur: 100 mg/dL — AB
RBC / HPF: >50 RBC/hpf — ABNORMAL HIGH (ref 0–5)
SPECIFIC GRAVITY, URINE: 1.014 (ref 1.005–1.030)
Squamous Epithelial / HPF: NONE SEEN (ref 0–5)
WBC, UA: >50 WBC/hpf — ABNORMAL HIGH (ref 0–5)
pH: 6 (ref 5.0–8.0)

## 2018-03-24 MED ORDER — CEPHALEXIN 500 MG PO CAPS: 500.0000 mg | ORAL_CAPSULE | Freq: Three times a day (TID) | ORAL | 0 refills | Status: AC

## 2018-03-24 NOTE — Telephone Encounter (Signed)
Informed patient's wife (and patient in the background) of positive urine culture & script sent to pharmacy. Questions answered. Wife & patient voice understanding.

## 2018-03-24 NOTE — Telephone Encounter (Signed)
-----   Message from Vanna Scotland, MD sent at 03/24/2018  4:57 PM EDT ----- Previous culture grew E. coli which was sensitive to Keflex.  Lets go ahead and start him on this and hope was correct.  Please prescribe Keflex 500 mg 3 times daily for 7 days to start tomorrow.  Vanna Scotland, MD  ----- Message ----- From: Nicolasa Ducking, RN Sent: 03/24/2018   3:38 PM EDT To: Nicolasa Ducking, RN, Vanna Scotland, MD  Do we need to call in abx now or wait for sensitivities?  Surgery is 03/29/2018.  Thanks

## 2018-03-24 NOTE — Patient Instructions (Signed)
Your procedure is scheduled on: Monday 03/29/18.  Report to DAY SURGERY DEPARTMENT LOCATED ON 2ND FLOOR MEDICAL MALL ENTRANCE. To find out your arrival time please call [phone] between 1PM - 3PM on Friday 03/26/18  Remember: Instructions that are not followed completely may result in serious medical risk, up to and including death, or upon the discretion of your surgeon and anesthesiologist your surgery may need to be rescheduled.     _X__ 1. Do not eat food after midnight the night before your procedure.                 No gum chewing or hard candies. You may drink clear liquids up to 2 hours                 before you are scheduled to arrive for your surgery- DO not drink clear                 liquids within 2 hours of the start of your surgery.                 Clear Liquids include:  water, apple juice without pulp, clear carbohydrate                 drink such as Clearfast or Gatorade, Black Coffee or Tea (Do not add                 anything to coffee or tea).  __X__2.  On the morning of surgery brush your teeth with toothpaste and water, you may rinse your mouth with mouthwash if you wish.  Do not swallow any toothpaste of mouthwash.     _X__ 3.  No Alcohol for 24 hours before or after surgery.   _X__ 4.  Do Not Smoke or use e-cigarettes For 24 Hours Prior to Your Surgery.                 Do not use any chewable tobacco products for at least 6 hours prior to                 surgery.  ____  5.  Bring all medications with you on the day of surgery if instructed.   __X__  6.  Notify your doctor if there is any change in your medical condition      (cold, fever, infections).     Do not wear jewelry, make-up, hairpins, clips or nail polish. Do not wear lotions, powders, or perfumes.  Do not shave 48 hours prior to surgery. Men may shave face and neck. Do not bring valuables to the hospital.    [facility] is not responsible for any belongings or valuables.  Contacts,  dentures/partials or body piercings may not be worn into surgery. Bring a case for your contacts, glasses or hearing aids, a denture cup will be supplied. Leave your suitcase in the car. After surgery it may be brought to your room. For patients admitted to the hospital, discharge time is determined by your treatment team.   Patients discharged the day of surgery will not be allowed to drive home.   Please read over the following fact sheets that you were given:   MRSA Information  __X__ Take these medicines the morning of surgery with A SIP OF WATER:     1. NONE  2.   3.   4.  5.  6.    __X__ Use CHG Soap as directed.   __X__ Stop  Anti-inflammatories 7 days before surgery such as Advil, Ibuprofen, Motrin, BC or Goodies Powder, Naprosyn, Naproxen, Aleve, Aspirin, Meloxicam. May take Tylenol if needed for pain or discomfort.

## 2018-03-24 NOTE — Pre-Procedure Instructions (Signed)
Notified Amy Heidel with Dr. Delana Meyer office of the patient's U/A results.

## 2018-03-26 ENCOUNTER — Ambulatory Visit (INDEPENDENT_AMBULATORY_CARE_PROVIDER_SITE_OTHER): Payer: Medicare Other | Admitting: Urology

## 2018-03-26 ENCOUNTER — Ambulatory Visit
Admission: RE | Admit: 2018-03-26 | Discharge: 2018-03-26 | Disposition: A | Discharge status: Home / Self Care | Payer: Medicare Other | Source: Ambulatory Visit | Attending: Urology | Admitting: Urology

## 2018-03-26 DIAGNOSIS — N201 Calculus of ureter: Secondary | ICD-10-CM | POA: Insufficient documentation

## 2018-03-26 DIAGNOSIS — T83022A Displacement of nephrostomy catheter, initial encounter: Secondary | ICD-10-CM | POA: Diagnosis not present

## 2018-03-26 LAB — URINE CULTURE

## 2018-03-26 NOTE — Progress Notes (Signed)
67 year old male with an impacted right ureteral stone who presents today as a walk-in visit due to dislodgment of his 5 [place] nephroureteral catheter.  He states that the stitch pulled out as well as a fair length of the tube.  He also was changing the dressing and nicked the tube itself.  Upon evaluation, it appeared that the nephroureteral catheter was almost completely dislodged with only a small portion remaining in his flank.  It was removed completely at the bedside.  The site itself appeared to be clean dry and intact.  A dressing of 4 x 4 and Tegaderm was applied.  We will send him today for KUB to ensure that the double-J ureteral stent is in adequate position prior to surgery on Monday.  He is currently on preoperative antibiotics in the setting of a positive urine culture.  1. Right ureteral stone - DG Abd 1 View; Future  2. Nephrostomy tube displaced (HCC)   Vanna Scotland, MD

## 2018-03-28 MED ORDER — CEFAZOLIN SODIUM-DEXTROSE 2-4 GM/100ML-% IV SOLN: 2.0000 g | INTRAVENOUS | Status: DC

## 2018-03-29 ENCOUNTER — Encounter
Admission: RE | Disposition: A | Discharge status: Home / Self Care | Payer: Self-pay | Source: Ambulatory Visit | Attending: Urology

## 2018-03-29 ENCOUNTER — Ambulatory Visit: Payer: Medicare Other | Admitting: Anesthesiology

## 2018-03-29 ENCOUNTER — Ambulatory Visit
Admission: RE | Admit: 2018-03-29 | Discharge: 2018-03-29 | Disposition: A | Discharge status: Home / Self Care | Payer: Medicare Other | Source: Ambulatory Visit | Attending: Urology | Admitting: Urology

## 2018-03-29 ENCOUNTER — Encounter: Payer: Self-pay | Admitting: *Deleted

## 2018-03-29 DIAGNOSIS — I10 Essential (primary) hypertension: Secondary | ICD-10-CM | POA: Insufficient documentation

## 2018-03-29 DIAGNOSIS — Z79899 Other long term (current) drug therapy: Secondary | ICD-10-CM | POA: Insufficient documentation

## 2018-03-29 DIAGNOSIS — Z7982 Long term (current) use of aspirin: Secondary | ICD-10-CM | POA: Insufficient documentation

## 2018-03-29 DIAGNOSIS — I252 Old myocardial infarction: Secondary | ICD-10-CM | POA: Diagnosis not present

## 2018-03-29 DIAGNOSIS — Z881 Allergy status to other antibiotic agents status: Secondary | ICD-10-CM | POA: Diagnosis not present

## 2018-03-29 DIAGNOSIS — Z951 Presence of aortocoronary bypass graft: Secondary | ICD-10-CM | POA: Diagnosis not present

## 2018-03-29 DIAGNOSIS — Z936 Other artificial openings of urinary tract status: Secondary | ICD-10-CM | POA: Insufficient documentation

## 2018-03-29 DIAGNOSIS — E785 Hyperlipidemia, unspecified: Secondary | ICD-10-CM | POA: Insufficient documentation

## 2018-03-29 DIAGNOSIS — Z87442 Personal history of urinary calculi: Secondary | ICD-10-CM | POA: Diagnosis not present

## 2018-03-29 DIAGNOSIS — N201 Calculus of ureter: Secondary | ICD-10-CM

## 2018-03-29 DIAGNOSIS — N132 Hydronephrosis with renal and ureteral calculous obstruction: Secondary | ICD-10-CM | POA: Diagnosis not present

## 2018-03-29 DIAGNOSIS — I2510 Atherosclerotic heart disease of native coronary artery without angina pectoris: Secondary | ICD-10-CM | POA: Insufficient documentation

## 2018-03-29 HISTORY — PX: CYSTOSCOPY/URETEROSCOPY/HOLMIUM LASER/STENT PLACEMENT: SHX6546

## 2018-03-29 SURGERY — CYSTOSCOPY/URETEROSCOPY/HOLMIUM LASER/STENT PLACEMENT
Anesthesia: General | Laterality: Right | Wound class: Clean Contaminated

## 2018-03-29 MED ORDER — TAMSULOSIN HCL 0.4 MG PO CAPS: 0.4000 mg | ORAL_CAPSULE | Freq: Every day | ORAL | 0 refills | Status: DC

## 2018-03-29 MED ORDER — OXYBUTYNIN CHLORIDE 5 MG PO TABS: 5.0000 mg | ORAL_TABLET | Freq: Three times a day (TID) | ORAL | 0 refills | Status: DC | PRN

## 2018-03-29 MED ORDER — ROCURONIUM BROMIDE 50 MG/5ML IV SOLN
INTRAVENOUS | Status: AC
  Filled 2018-03-29: qty 1

## 2018-03-29 MED ORDER — ACETAMINOPHEN 160 MG/5ML PO SOLN
325.0000 mg | ORAL | Status: DC | PRN
  Filled 2018-03-29: qty 20.3

## 2018-03-29 MED ORDER — SUGAMMADEX SODIUM 500 MG/5ML IV SOLN
INTRAVENOUS | Status: AC
  Filled 2018-03-29: qty 5

## 2018-03-29 MED ORDER — ACETAMINOPHEN 325 MG PO TABS: 325.0000 mg | ORAL_TABLET | ORAL | Status: DC | PRN

## 2018-03-29 MED ORDER — SUGAMMADEX SODIUM 500 MG/5ML IV SOLN
INTRAVENOUS | Status: DC | PRN
  Administered 2018-03-29: 210 mg via INTRAVENOUS

## 2018-03-29 MED ORDER — LIDOCAINE HCL (CARDIAC) PF 100 MG/5ML IV SOSY
PREFILLED_SYRINGE | INTRAVENOUS | Status: DC | PRN
  Administered 2018-03-29: 100 mg via INTRAVENOUS

## 2018-03-29 MED ORDER — CEFAZOLIN SODIUM-DEXTROSE 2-4 GM/100ML-% IV SOLN
INTRAVENOUS | Status: AC
  Filled 2018-03-29: qty 100

## 2018-03-29 MED ORDER — PROPOFOL 10 MG/ML IV BOLUS
INTRAVENOUS | Status: AC
  Filled 2018-03-29: qty 20

## 2018-03-29 MED ORDER — HYDROCODONE-ACETAMINOPHEN 7.5-325 MG PO TABS
1.0000 | ORAL_TABLET | Freq: Once | ORAL | Status: DC | PRN
  Filled 2018-03-29: qty 1

## 2018-03-29 MED ORDER — PROPOFOL 10 MG/ML IV BOLUS
INTRAVENOUS | Status: DC | PRN
  Administered 2018-03-29: 200 mg via INTRAVENOUS

## 2018-03-29 MED ORDER — DEXAMETHASONE SODIUM PHOSPHATE 10 MG/ML IJ SOLN
INTRAMUSCULAR | Status: DC | PRN
  Administered 2018-03-29: 10 mg via INTRAVENOUS

## 2018-03-29 MED ORDER — ROCURONIUM BROMIDE 100 MG/10ML IV SOLN
INTRAVENOUS | Status: DC | PRN
  Administered 2018-03-29: 30 mg via INTRAVENOUS
  Administered 2018-03-29: 20 mg via INTRAVENOUS

## 2018-03-29 MED ORDER — EPHEDRINE SULFATE 50 MG/ML IJ SOLN
INTRAMUSCULAR | Status: DC | PRN
  Administered 2018-03-29: 15 mg via INTRAVENOUS
  Administered 2018-03-29: 5 mg via INTRAVENOUS

## 2018-03-29 MED ORDER — KETOROLAC TROMETHAMINE 30 MG/ML IJ SOLN
INTRAMUSCULAR | Status: AC
  Filled 2018-03-29: qty 1

## 2018-03-29 MED ORDER — LIDOCAINE HCL (PF) 2 % IJ SOLN
INTRAMUSCULAR | Status: AC
  Filled 2018-03-29: qty 10

## 2018-03-29 MED ORDER — MEPERIDINE HCL 50 MG/ML IJ SOLN: 6.2500 mg | INTRAMUSCULAR | Status: DC | PRN

## 2018-03-29 MED ORDER — FENTANYL CITRATE (PF) 100 MCG/2ML IJ SOLN
INTRAMUSCULAR | Status: AC
  Filled 2018-03-29: qty 2

## 2018-03-29 MED ORDER — PROMETHAZINE HCL 25 MG/ML IJ SOLN: 6.2500 mg | INTRAMUSCULAR | Status: DC | PRN

## 2018-03-29 MED ORDER — IOTHALAMATE MEGLUMINE 43 % IV SOLN
INTRAVENOUS | Status: DC | PRN
  Administered 2018-03-29: 30 mL via SURGICAL_CAVITY

## 2018-03-29 MED ORDER — DOCUSATE SODIUM 100 MG PO CAPS: 100.0000 mg | ORAL_CAPSULE | Freq: Two times a day (BID) | ORAL | 0 refills | Status: DC

## 2018-03-29 MED ORDER — FENTANYL CITRATE (PF) 100 MCG/2ML IJ SOLN
INTRAMUSCULAR | Status: DC | PRN
  Administered 2018-03-29: 100 ug via INTRAVENOUS

## 2018-03-29 MED ORDER — ACETAMINOPHEN 10 MG/ML IV SOLN
INTRAVENOUS | Status: DC | PRN
  Administered 2018-03-29: 1000 mg via INTRAVENOUS

## 2018-03-29 MED ORDER — LACTATED RINGERS IV SOLN
INTRAVENOUS | Status: DC
  Administered 2018-03-29 (×2): via INTRAVENOUS

## 2018-03-29 MED ORDER — FENTANYL CITRATE (PF) 100 MCG/2ML IJ SOLN: 25.0000 ug | INTRAMUSCULAR | Status: DC | PRN

## 2018-03-29 MED ORDER — SUCCINYLCHOLINE CHLORIDE 20 MG/ML IJ SOLN
INTRAMUSCULAR | Status: AC
  Filled 2018-03-29: qty 1

## 2018-03-29 MED ORDER — DEXAMETHASONE SODIUM PHOSPHATE 10 MG/ML IJ SOLN
INTRAMUSCULAR | Status: AC
  Filled 2018-03-29: qty 1

## 2018-03-29 MED ORDER — ACETAMINOPHEN 10 MG/ML IV SOLN
INTRAVENOUS | Status: AC
  Filled 2018-03-29: qty 100

## 2018-03-29 MED ORDER — FAMOTIDINE 20 MG PO TABS
ORAL_TABLET | ORAL | Status: AC
  Administered 2018-03-29: 20 mg via ORAL
  Filled 2018-03-29: qty 1

## 2018-03-29 MED ORDER — SUCCINYLCHOLINE CHLORIDE 20 MG/ML IJ SOLN
INTRAMUSCULAR | Status: DC | PRN
  Administered 2018-03-29: 140 mg via INTRAVENOUS

## 2018-03-29 MED ORDER — HYDROCODONE-ACETAMINOPHEN 5-325 MG PO TABS: 1.0000 | ORAL_TABLET | Freq: Four times a day (QID) | ORAL | 0 refills | Status: DC | PRN

## 2018-03-29 MED ORDER — ONDANSETRON HCL 4 MG/2ML IJ SOLN
INTRAMUSCULAR | Status: AC
  Filled 2018-03-29: qty 2

## 2018-03-29 MED ORDER — KETOROLAC TROMETHAMINE 30 MG/ML IJ SOLN
INTRAMUSCULAR | Status: DC | PRN
  Administered 2018-03-29: 30 mg via INTRAVENOUS

## 2018-03-29 MED ORDER — EPHEDRINE SULFATE 50 MG/ML IJ SOLN
INTRAMUSCULAR | Status: AC
  Filled 2018-03-29: qty 1

## 2018-03-29 MED ORDER — FAMOTIDINE 20 MG PO TABS
20.0000 mg | ORAL_TABLET | Freq: Once | ORAL | Status: AC
  Administered 2018-03-29: 20 mg via ORAL

## 2018-03-29 MED ORDER — ONDANSETRON HCL 4 MG/2ML IJ SOLN
INTRAMUSCULAR | Status: DC | PRN
  Administered 2018-03-29: 4 mg via INTRAVENOUS

## 2018-03-29 SURGICAL SUPPLY — 34 items
BAG DRAIN CYSTO-URO LG1000N (MISCELLANEOUS) ×3 IMPLANT
BASKET ZERO TIP 1.9FR (BASKET) ×2 IMPLANT
BRUSH SCRUB EZ 1% IODOPHOR (MISCELLANEOUS) ×3 IMPLANT
BSKT STON RTRVL ZERO TP 1.9FR (BASKET) ×1
CATH URETL 5X70 OPEN END (CATHETERS) ×3 IMPLANT
CNTNR SPEC 2.5X3XGRAD LEK (MISCELLANEOUS)
CONRAY 43 FOR UROLOGY 50M (MISCELLANEOUS) ×3 IMPLANT
CONT SPEC 4OZ STER OR WHT (MISCELLANEOUS)
CONT SPEC 4OZ STRL OR WHT (MISCELLANEOUS)
CONTAINER SPEC 2.5X3XGRAD LEK (MISCELLANEOUS) IMPLANT
DRAPE UTILITY 15X26 TOWEL STRL (DRAPES) ×3 IMPLANT
FIBER LASER LITHO 273 (Laser) ×2 IMPLANT
GLIDEWIRE STR 0.035 150CM 3CM (WIRE) ×2 IMPLANT
GLOVE BIO SURGEON STRL SZ 6.5 (GLOVE) ×2 IMPLANT
GLOVE BIO SURGEONS STRL SZ 6.5 (GLOVE) ×1
GOWN STRL REUS W/ TWL LRG LVL3 (GOWN DISPOSABLE) ×2 IMPLANT
GOWN STRL REUS W/TWL LRG LVL3 (GOWN DISPOSABLE) ×6
GUIDEWIRE GREEN .038 145CM (MISCELLANEOUS) IMPLANT
INFUSOR MANOMETER BAG 3000ML (MISCELLANEOUS) ×3 IMPLANT
INTRODUCER DILATOR DOUBLE (INTRODUCER) ×2 IMPLANT
KIT TURNOVER CYSTO (KITS) ×3 IMPLANT
PACK CYSTO AR (MISCELLANEOUS) ×3 IMPLANT
SENSORWIRE 0.038 NOT ANGLED (WIRE) ×3
SET CYSTO W/LG BORE CLAMP LF (SET/KITS/TRAYS/PACK) ×3 IMPLANT
SHEATH URETERAL 12FRX35CM (MISCELLANEOUS) IMPLANT
SOL .9 NS 3000ML IRR  AL (IV SOLUTION) ×2
SOL .9 NS 3000ML IRR AL (IV SOLUTION) ×1
SOL .9 NS 3000ML IRR UROMATIC (IV SOLUTION) ×1 IMPLANT
STENT CONTOUR 7FRX26 (STENTS) ×2 IMPLANT
STENT URET 6FRX24 CONTOUR (STENTS) IMPLANT
SURGILUBE 2OZ TUBE FLIPTOP (MISCELLANEOUS) ×3 IMPLANT
WATER STERILE IRR 1000ML POUR (IV SOLUTION) ×3 IMPLANT
WIRE AMPLATZ SSTIFF .035X260CM (WIRE) ×2 IMPLANT
WIRE SENSOR 0.038 NOT ANGLED (WIRE) ×1 IMPLANT

## 2018-03-29 NOTE — Discharge Instructions (Addendum)
You have a ureteral stent in place.  This is a tube that extends from your kidney to your bladder.  This may cause urinary bleeding, burning with urination, and urinary frequency.  Please call our office or present to the ED if you develop fevers >101 or pain which is not able to be controlled with oral pain medications.  You may be given either Flomax and/ or ditropan to help with bladder spasms and stent pain in addition to pain medications.   ° °[facility] °[address], [address] °[place], [place] [address] °[phone] ° ° ° °AMBULATORY SURGERY  °DISCHARGE INSTRUCTIONS ° ° °1) The drugs that you were given will stay in your system until tomorrow so for the next 24 hours you should not: ° °A) Drive an automobile °B) Make any legal decisions °C) Drink any alcoholic beverage ° ° °2) You may resume regular meals tomorrow.  Today it is better to start with liquids and gradually work up to solid foods. ° °You may eat anything you prefer, but it is better to start with liquids, then soup and crackers, and gradually work up to solid foods. ° ° °3) Please notify your doctor immediately if you have any unusual bleeding, trouble breathing, redness and pain at the surgery site, drainage, fever, or pain not relieved by medication. ° ° ° °4) Additional Instructions: ° ° ° ° ° ° ° °Please contact your physician with any problems or Same Day Surgery at [phone], Monday through Friday 6 am to 4 pm, or [facility] number at [phone]. °

## 2018-03-29 NOTE — Anesthesia Post-op Follow-up Note (Signed)
Anesthesia QCDR form completed.        

## 2018-03-29 NOTE — Transfer of Care (Signed)
Immediate Anesthesia Transfer of Care Note  Patient: Robert Fields  Procedure(s) Performed: CYSTOSCOPY/URETEROSCOPY/HOLMIUM LASER/STENT PLACEMENT (Right )  Patient Location: PACU  Anesthesia Type:General  Level of Consciousness: sedated and responds to stimulation  Airway & Oxygen Therapy: Patient Spontanous Breathing and Patient connected to face mask oxygen  Post-op Assessment: Report given to RN and Post -op Vital signs reviewed and stable  Post vital signs: Reviewed and stable  Last Vitals:  Vitals Value Taken Time  BP 120/81 03/29/2018  5:04 PM  Temp 36.4 C 03/29/2018  5:02 PM  Pulse 78 03/29/2018  5:05 PM  Resp 14 03/29/2018  5:05 PM  SpO2 100 % 03/29/2018  5:05 PM  Vitals shown include unvalidated device data.  Last Pain:  Vitals:   03/29/18 1231  TempSrc: Tympanic  PainSc: 0-No pain         Complications: No apparent anesthesia complications

## 2018-03-29 NOTE — Op Note (Signed)
Date of procedure: 03/29/18  Preoperative diagnosis:  1. Right ureteral calculus 2. Right hydronephrosis 3. Right renal atrophy  Postoperative diagnosis:  1. Same as above  Procedure: 1. Right ureteroscopy 2. Laser lithotripsy 3. Right ureteral stent exchange 4. Right retrograde pyelogram  Surgeon: Vanna Scotland, MD  Anesthesia: General  Complications: None  Intraoperative findings: Highly impacted right ureteral stone at the level of the iliacs.  Significant bullous edema surrounding stone.  Approximately 85% of stone treated today with some residual impacted stone in wall of the ureter.  Plan for staged procedure.  EBL: Minimal  Specimens: None  Drains: 7 x 26 French double-J ureteral stent on right  Indication: Robert Fields is a 67 y.o. patient with heavily impacted 13 mm right ureteral calculus status post failed retrograde stent in the past status post antegrade double-J ureteral stent placement by interventional radiology who returns today for definitive management of the stone please see previous note for details..  After reviewing the management options for treatment, he elected to proceed with the above surgical procedure(s). We have discussed the potential benefits and risks of the procedure, side effects of the proposed treatment, the likelihood of the patient achieving the goals of the procedure, and any potential problems that might occur during the procedure or recuperation. Informed consent has been obtained.  Description of procedure:  The patient was taken to the operating room and general anesthesia was induced.  The patient was placed in the dorsal lithotomy position, prepped and draped in the usual sterile fashion, and preoperative antibiotics were administered. A preoperative time-out was performed.   21 French rigid cystoscope was advanced per urethra into the bladder.  Ureteral stent was seen emanating from the right UO.  Stent graspers were used to  gently pulled the stent to the level the urethral meatus under fluoroscopy.  The stent was then cannulated using a sensor wire up to level the kidney which was snapped in place removing the stent.  4.5 semirigid ureteroscope was then advanced up to level of the stone radiographically but no stone was immediately seen.  There was a large amount of bullous tissue surrounding the stone masking the stone which made treatment initially somewhat complicated.  Ultimately after several attempts was able to get second sensor wire around the stone and was able to railroad the semirigid ureteroscope over this wire just beyond the stone.  Upon backing the scope up, I did encounter the stone.  A 273 m laser fiber was then brought in and using the settings of 0.8 J and 10 Hz, a portion of the stone was fragmented to create a little more working space.  Due to angulation and torquing of the scalp, I did not feel comfortable using semirigid at this point anymore.  Under direct visualization, I was able to get a Super Stiff wire around the stone up to level of the kidney.  At this point time, a 7 [place] digital ureteroscope was then brought in and advanced just beyond the stone.  Again upon backing the scope up is when I was able to more clearly visualize the stone.  This was technically somewhat challenging given the location within the distal ureter and the angulation as well as the heavily impacted nature of the stone lodged into the wall of the ureter.  It took careful care not to avoid any injury to the ureter itself although there were several mucosal abrasions, bleeding, and edema noted throughout which worsened somewhat throughout the case.  I  was able to treat approximately 75% of the stone at this point in time with large fragments filling the distal ureter.  Ultimately, I advanced the 4.5 semirigid ureteroscope up again and was able to address a small additional portion of the stone, approximately 10% more.  At this  point time, there was a semilunar shaped stone with a wire traversing through it which is embedded into the wall of the ureter and I had difficulty achieving this angle.  There is somewhat poor visualization in the ureter appeared somewhat traumatized.  As such, the decision was made to stop today with a planned staged procedure in order to allow for passage of significant stone fragment spontaneously with a stent as well as allow for some of the edema and bleeding to resolve.  The original wire did appear somewhat ragged due to laser trauma and manipulation thus a 5 [place] open-ended ureteral catheter was advanced over this wire up to level the proximal ureter.  This wire was removed.  A retrograde pyelogram was performed through the 5 [place] open-ended ureteral catheter which showed a persistently severely hydronephrotic ureter and kidney on the side down to the level of the stone.  Contrast did now go through the area where the stone was located into the distal ureter which had previously been a complete ureteral obstruction.  There is no extravasation appreciated.  A new sensor wire was replaced through the open-ended.  This was backloaded over rigid cystoscope and a 7 x 6 French double-J ureteral stent was advanced over the wire up to level the kidney.  The wire was partially drawn until focal stone within the renal pelvis.  The wires and fully withdrawn a full coils noted within the bladder.  The bladder was then drained.  The patient was then clean and dry, repositioned supine position, reversed anesthesia, and taken to the PACU in stable condition.  Plan: I will book this patient for another retrograde endoscopic procedure in 2 to 3 weeks.  Vanna Scotland, M.D.

## 2018-03-29 NOTE — Anesthesia Preprocedure Evaluation (Signed)
Anesthesia Evaluation  Patient identified by MRN, date of birth, ID band Patient awake    Reviewed: Allergy & Precautions, H&P , NPO status , reviewed documented beta blocker date and time   Airway Mallampati: III  TM Distance: >3 FB Neck ROM: full    Dental  (+) Chipped   Pulmonary    Pulmonary exam normal        Cardiovascular hypertension, + CAD, + Past MI and + Peripheral Vascular Disease  Normal cardiovascular exam     Neuro/Psych    GI/Hepatic   Endo/Other    Renal/GU Renal disease     Musculoskeletal   Abdominal   Peds  Hematology   Anesthesia Other Findings Past Medical History: No date: CAD (coronary artery disease) No date: History of kidney stones No date: Hyperlipemia No date: Hypertension No date: MI (myocardial infarction) (HCC) Past Surgical History: No date: CARDIAC CATHETERIZATION 2004: CORONARY ARTERY BYPASS GRAFT     Comment:  5 vessel 02/08/2018: CYSTOSCOPY/RETROGRADE/URETEROSCOPY; Left     Comment:  Procedure: CYSTOSCOPY/RETROGRADE/URETEROSCOPY;  Surgeon:              Vanna Scotland, MD;  Location: [facility] ORS;  Service:               Urology;  Laterality: Left; 03/17/2018: IR CONVERT RIGHT NEPHROSTOMY TO NEPHROURETERAL CATH 02/11/2018: IR NEPHROSTOMY PLACEMENT RIGHT   Reproductive/Obstetrics                             Anesthesia Physical Anesthesia Plan  ASA: III  Anesthesia Plan: General ETT   Post-op Pain Management:    Induction: Intravenous  PONV Risk Score and Plan: Ondansetron, Treatment may vary due to age or medical condition, Midazolam and Metaclopromide  Airway Management Planned: Oral ETT  Additional Equipment:   Intra-op Plan:   Post-operative Plan: Extubation in OR  Informed Consent: I have reviewed the patients History and Physical, chart, labs and discussed the procedure including the risks, benefits and alternatives for the proposed  anesthesia with the patient or authorized representative who has indicated his/her understanding and acceptance.   Dental Advisory Given  Plan Discussed with: CRNA  Anesthesia Plan Comments:         Anesthesia Quick Evaluation

## 2018-03-29 NOTE — Interval H&P Note (Signed)
History and Physical Interval Note:  03/29/2018 1:52 PM  [name]  has presented today for surgery, with the diagnosis of right ureteral stone  The various methods of treatment have been discussed with the patient and family. After consideration of risks, benefits and other options for treatment, the patient has consented to  Procedure(s) with comments: CYSTOSCOPY/URETEROSCOPY/HOLMIUM LASER/STENT PLACEMENT (Right) - antegrade vs retrograde as a surgical intervention .  The patient's history has been reviewed, patient examined, no change in status, stable for surgery.  I have reviewed the patient's chart and labs.  Questions were answered to the patient's satisfaction.    RRR CTAB  Robert Fields

## 2018-03-29 NOTE — Anesthesia Procedure Notes (Signed)
Procedure Name: Intubation Date/Time: 03/29/2018 2:19 PM Performed by: Aline Brochure, CRNA Pre-anesthesia Checklist: Patient identified, Emergency Drugs available, Suction available and Patient being monitored Patient Re-evaluated:Patient Re-evaluated prior to induction Oxygen Delivery Method: Circle system utilized Preoxygenation: Pre-oxygenation with 100% oxygen Induction Type: IV induction Ventilation: Mask ventilation without difficulty Laryngoscope Size: Mac and 3 Grade View: Grade II Tube type: Oral Tube size: 7.5 mm Number of attempts: 1 Airway Equipment and Method: Stylet Placement Confirmation: ETT inserted through vocal cords under direct vision,  positive ETCO2 and breath sounds checked- equal and bilateral Secured at: 22 cm Tube secured with: Tape Dental Injury: Teeth and Oropharynx as per pre-operative assessment

## 2018-03-29 NOTE — Anesthesia Postprocedure Evaluation (Signed)
Anesthesia Post Note  Patient: Robert Fields  Procedure(s) Performed: CYSTOSCOPY/URETEROSCOPY/HOLMIUM LASER/STENT PLACEMENT (Right )  Patient location during evaluation: PACU Anesthesia Type: General Level of consciousness: awake and alert Pain management: pain level controlled Vital Signs Assessment: post-procedure vital signs reviewed and stable Respiratory status: spontaneous breathing, nonlabored ventilation, respiratory function stable and patient connected to nasal cannula oxygen Cardiovascular status: blood pressure returned to baseline and stable Postop Assessment: no apparent nausea or vomiting Anesthetic complications: no     Last Vitals:  Vitals:   03/29/18 1730 03/29/18 1739  BP: 137/88 (!) 150/93  Pulse: 73 68  Resp: 17 18  Temp: (!) 36.4 C (!) 36.1 C  SpO2: 98% 99%    Last Pain:  Vitals:   03/29/18 1739  TempSrc: Temporal  PainSc: 0-No pain                 Cleda Mccreedy Piscitello

## 2018-03-30 ENCOUNTER — Encounter: Payer: Self-pay | Admitting: Urology

## 2018-03-31 ENCOUNTER — Encounter: Payer: Self-pay | Admitting: Urology

## 2018-03-31 ENCOUNTER — Ambulatory Visit: Payer: Medicare Other | Admitting: Urology

## 2018-03-31 ENCOUNTER — Other Ambulatory Visit: Payer: Self-pay | Admitting: Radiology

## 2018-03-31 ENCOUNTER — Telehealth: Payer: Self-pay | Admitting: Urology

## 2018-03-31 ENCOUNTER — Other Ambulatory Visit
Admission: RE | Admit: 2018-03-31 | Discharge: 2018-03-31 | Disposition: A | Discharge status: Home / Self Care | Payer: Medicare Other | Source: Ambulatory Visit | Attending: Urology | Admitting: Urology

## 2018-03-31 VITALS — BP 152/107 | HR 102 | Temp 99.3°F

## 2018-03-31 DIAGNOSIS — N12 Tubulo-interstitial nephritis, not specified as acute or chronic: Secondary | ICD-10-CM | POA: Diagnosis not present

## 2018-03-31 DIAGNOSIS — N201 Calculus of ureter: Secondary | ICD-10-CM | POA: Diagnosis not present

## 2018-03-31 LAB — MICROSCOPIC EXAMINATION
EPITHELIAL CELLS (NON RENAL): NONE SEEN /HPF (ref 0–10)
RBC, UA: >30 /hpf — ABNORMAL HIGH (ref 0–2)

## 2018-03-31 LAB — URINALYSIS, COMPLETE
BILIRUBIN UA: NEGATIVE
GLUCOSE, UA: NEGATIVE
Ketones, UA: NEGATIVE
Nitrite, UA: NEGATIVE
SPEC GRAV UA: 1.015 (ref 1.005–1.030)
UUROB: 0.2 mg/dL (ref 0.2–1.0)
pH, UA: 7 (ref 5.0–7.5)

## 2018-03-31 LAB — CBC
HCT: 40.1 % (ref 40.0–52.0)
Hemoglobin: 13.8 g/dL (ref 13.0–18.0)
MCH: 30.8 pg (ref 26.0–34.0)
MCHC: 34.3 g/dL (ref 32.0–36.0)
MCV: 89.6 fL (ref 80.0–100.0)
PLATELETS: 273 10*3/uL (ref 150–440)
RBC: 4.48 MIL/uL (ref 4.40–5.90)
RDW: 13 % (ref 11.5–14.5)
WBC: 9.8 10*3/uL (ref 3.8–10.6)

## 2018-03-31 LAB — BASIC METABOLIC PANEL
Anion gap: 12 (ref 5–15)
BUN: 17 mg/dL (ref 8–23)
CALCIUM: 8.7 mg/dL — AB (ref 8.9–10.3)
CO2: 26 mmol/L (ref 22–32)
CREATININE: 1.39 mg/dL — AB (ref 0.61–1.24)
Chloride: 97 mmol/L — ABNORMAL LOW (ref 98–111)
GFR calc Af Amer: 60 mL/min — ABNORMAL LOW (ref >60)
GFR calc non Af Amer: 52 mL/min — ABNORMAL LOW (ref >60)
Glucose, Bld: 123 mg/dL — ABNORMAL HIGH (ref 70–99)
Potassium: 4.1 mmol/L (ref 3.5–5.1)
SODIUM: 135 mmol/L (ref 135–145)

## 2018-03-31 LAB — LACTIC ACID, PLASMA: Lactic Acid, Venous: 1.5 mmol/L (ref 0.5–1.9)

## 2018-03-31 MED ORDER — CEFTRIAXONE SODIUM 1 G IJ SOLR
1.0000 g | Freq: Once | INTRAMUSCULAR | Status: AC
  Administered 2018-03-31: 1 g via INTRAMUSCULAR

## 2018-03-31 NOTE — H&P (View-Only) (Signed)
03/31/2018 12:53 PM   Robert Fields 1951/02/04 [id number]  Referring provider: Lynnea Ferrier, MD [address] [facility] [place], [place] [address]  Chief Complaint  Patient presents with  . Post-op Problem    HPI: 67 year old male status post right ureteroscopy with laser lithotripsy and stent exchange on Monday who presents to the office with a fever to 102 early this morning.  He also associated chills.  His current temperature is 99 after Tylenol.  He has no other complaints.  No dysuria or gross hematuria.  No flank pain.  Other than some mild fatigue, he feels well.  Blood pressure stable in the office.  He is mildly tachycardic but otherwise normotensive.  He did have a positive urine culture with E. coli and was adequately treated for this and continues on Keflex.  The culture was sensitive to this but resistant to Zosyn, Cipro, potato and Bactrim.   PMH: Past Medical History:  Diagnosis Date  . CAD (coronary artery disease)   . History of kidney stones   . Hyperlipemia   . Hypertension   . MI (myocardial infarction) [facility])     Surgical History: Past Surgical History:  Procedure Laterality Date  . CARDIAC CATHETERIZATION    . CORONARY ARTERY BYPASS GRAFT  2004   5 vessel  . CYSTOSCOPY/RETROGRADE/URETEROSCOPY Left 02/08/2018   Procedure: CYSTOSCOPY/RETROGRADE/URETEROSCOPY;  Surgeon: Vanna Scotland, MD;  Location: [facility] ORS;  Service: Urology;  Laterality: Left;  . CYSTOSCOPY/URETEROSCOPY/HOLMIUM LASER/STENT PLACEMENT Right 03/29/2018   Procedure: CYSTOSCOPY/URETEROSCOPY/HOLMIUM LASER/STENT PLACEMENT;  Surgeon: Vanna Scotland, MD;  Location: [facility] ORS;  Service: Urology;  Laterality: Right;  antegrade vs retrograde  . IR CONVERT RIGHT NEPHROSTOMY TO NEPHROURETERAL CATH  03/17/2018  . IR NEPHROSTOMY PLACEMENT RIGHT  02/11/2018    Home Medications:  Allergies as of 03/31/2018      Reactions   Doxycycline Hyclate Rash      Medication List       Accurate as of 03/31/18 12:53 PM. Always use your most recent med list.          aspirin EC 81 MG tablet Take 81 mg by mouth at bedtime.   cephALEXin 500 MG capsule Commonly known as:  KEFLEX Take 1 capsule (500 mg total) by mouth 3 (three) times daily for 7 days.   docusate sodium 100 MG capsule Commonly known as:  COLACE Take 1 capsule (100 mg total) by mouth 2 (two) times daily.   ezetimibe 10 MG tablet Commonly known as:  ZETIA Take 10 mg by mouth at bedtime.   HYDROcodone-acetaminophen 5-325 MG tablet Commonly known as:  NORCO/VICODIN Take 1-2 tablets by mouth every 6 (six) hours as needed for moderate pain.   losartan 50 MG tablet Commonly known as:  COZAAR Take 50 mg by mouth at bedtime.   MULTI-VITAMINS Tabs Take 1 tablet by mouth at bedtime.   oxybutynin 5 MG tablet Commonly known as:  DITROPAN Take 1 tablet (5 mg total) by mouth every 8 (eight) hours as needed for bladder spasms.   rosuvastatin 40 MG tablet Commonly known as:  CRESTOR Take 40 mg by mouth at bedtime.   tamsulosin 0.4 MG Caps capsule Commonly known as:  FLOMAX Take 1 capsule (0.4 mg total) by mouth daily.       Allergies:  Allergies  Allergen Reactions  . Doxycycline Hyclate Rash    Family History: Family History  Problem Relation Age of Onset  . Heart failure Mother   . Lung cancer  Mother     Social History:  reports that he has never smoked. He has never used smokeless tobacco. He reports that he drinks about 2.0 standard drinks of alcohol per week. He reports that he does not use drugs.  ROS: UROLOGY Frequent Urination?: No Hard to postpone urination?: No Burning/pain with urination?: No Get up at night to urinate?: No Leakage of urine?: No Urine stream starts and stops?: No Trouble starting stream?: No Do you have to strain to urinate?: No Blood in urine?: No Urinary tract infection?: Yes Sexually transmitted disease?: No Injury to kidneys or bladder?:  No Painful intercourse?: No Weak stream?: No Erection problems?: No Penile pain?: No  Gastrointestinal Nausea?: No Vomiting?: No Indigestion/heartburn?: No Diarrhea?: No Constipation?: No  Constitutional Fever: Yes Night sweats?: No Weight loss?: No Fatigue?: No  Skin Skin rash/lesions?: No Itching?: No  Eyes Blurred vision?: No Double vision?: No  Ears/Nose/Throat Sore throat?: No Sinus problems?: No  Hematologic/Lymphatic Swollen glands?: No Easy bruising?: No  Cardiovascular Leg swelling?: No Chest pain?: No  Respiratory Cough?: No Shortness of breath?: No  Endocrine Excessive thirst?: No  Musculoskeletal Back pain?: No Joint pain?: No  Neurological Headaches?: No Dizziness?: No  Psychologic Depression?: No Anxiety?: No  Physical Exam: BP (!) 152/107   Pulse (!) 102   Temp 99.3 F (37.4 C)   Constitutional:  Alert and oriented, No acute distress. HEENT: [place] AT, moist mucus membranes.  Trachea midline, no masses. Cardiovascular: No clubbing, cyanosis, or edema. Respiratory: Normal respiratory effort, no increased work of breathing. GI: Abdomen is soft, nontender, nondistended, no abdominal masses GU: No CVA tenderness.  Right flank incision well-healed, no erythema or drainage. Skin: No rashes, bruises or suspicious lesions. Neurologic: Grossly intact, no focal deficits, moving all 4 extremities. Psychiatric: Normal mood and affect.  Laboratory Data: Lab Results  Component Value Date   WBC 9.8 03/31/2018   HGB 13.8 03/31/2018   HCT 40.1 03/31/2018   MCV 89.6 03/31/2018   PLT 273 03/31/2018    Lab Results  Component Value Date   CREATININE 1.39 (H) 03/31/2018   Component     Latest Ref Rng & Units 03/31/2018  Lactic Acid, Venous     0.5 - 1.9 mmol/L 1.5    Urinalysis Results for orders placed or performed in visit on 03/31/18  Microscopic Examination  Result Value Ref Range   WBC, UA >30 (H) 0 - 5 /hpf   RBC, UA >30 (H)  0 - 2 /hpf   Epithelial Cells (non renal) None seen 0 - 10 /hpf   Mucus, UA Present (A) Not Estab.   Bacteria, UA Many (A) None seen/Few  Urinalysis, Complete  Result Value Ref Range   Specific Gravity, UA 1.015 1.005 - 1.030   pH, UA 7.0 5.0 - 7.5   Color, UA Red (A) Yellow   Appearance Ur Cloudy (A) Clear   Leukocytes, UA 3+ (A) Negative   Protein, UA 2+ (A) Negative/Trace   Glucose, UA Negative Negative   Ketones, UA Negative Negative   RBC, UA 3+ (A) Negative   Bilirubin, UA Negative Negative   Urobilinogen, Ur 0.2 0.2 - 1.0 mg/dL   Nitrite, UA Negative Negative   Microscopic Examination See below:     Pertinent Imaging: N/a  Assessment & Plan:    1. Right ureteral stone Status post right ureteroscopy on Monday now with fever to 102 but otherwise hemodynamically stable, normotensive and labs are reassuring (no leukocytosis, normal lactate)  Clinically he  appears well.  As such, he is given a dose of ceftriaxone here with strict precautions to present to the emergency room if he continues to spike high fevers or starts to feel unwell.  Repeat UA/urine culture as well as blood culture obtained today.  He is administered ceftriaxone 1 g in the office today.  He was advised to come back to the office tomorrow if he continues to spike fevers for reassessment or to the emergency room as above.  Follow blood and urine cultures.  Plan for staged ureteroscopic procedure in 2 to 3 weeks to treat residual stone burden as per previously planned.  - Urinalysis, Complete - CBC; Future - Basic metabolic panel; Future - Blood culture; Future - Lactic Acid, Plasma; Future - cefTRIAXone (ROCEPHIN) injection 1 g - CULTURE, URINE COMPREHENSIVE  2. Pyelonephritis As above  Vanna Scotland, MD  [facility] [address] [place], [place] [address] [phone]

## 2018-03-31 NOTE — Progress Notes (Signed)
IM Injection  Patient is present today for an IM Injection.  Drug: Rocephin Dose:1 GM Location:left upper outer buttocks Lot: [id number] Exp:12/2019 Patient tolerated well, no complications were noted  Preformed by: Eligha Bridegroom, CMA

## 2018-03-31 NOTE — Telephone Encounter (Signed)
Spoke with patient and per Dr. Apolinar Junes was told to come to the office for evaluation

## 2018-03-31 NOTE — Telephone Encounter (Signed)
Pt called office to make aware that the pt woke up this morning with fever of 102, took tylenol, that has brought the fever down to 99, pt doesn't feel bad, no other symptoms but wanted to make aware of this, pt had surgery Monday.  Pt states his urine looks fine, pt was taking some antibiotics that was given to him but there are no refills on it. Pt wants to know if he needs refill and should he keep taking tylenol. Please advise pt @ [phone].

## 2018-03-31 NOTE — Progress Notes (Signed)
 03/31/2018 12:53 PM   Robert Fields 05/16/1951 [id number]  Referring provider: Klein, Bert J III, MD [address] [facility] [place], [place] [address]  Chief Complaint  Patient presents with  . Post-op Problem    HPI: 67-year-old male status post right ureteroscopy with laser lithotripsy and stent exchange on Monday who presents to the office with a fever to 102 early this morning.  He also associated chills.  His current temperature is 99 after Tylenol.  He has no other complaints.  No dysuria or gross hematuria.  No flank pain.  Other than some mild fatigue, he feels well.  Blood pressure stable in the office.  He is mildly tachycardic but otherwise normotensive.  He did have a positive urine culture with E. coli and was adequately treated for this and continues on Keflex.  The culture was sensitive to this but resistant to Zosyn, Cipro, potato and Bactrim.   PMH: Past Medical History:  Diagnosis Date  . CAD (coronary artery disease)   . History of kidney stones   . Hyperlipemia   . Hypertension   . MI (myocardial infarction) (HCC)     Surgical History: Past Surgical History:  Procedure Laterality Date  . CARDIAC CATHETERIZATION    . CORONARY ARTERY BYPASS GRAFT  2004   5 vessel  . CYSTOSCOPY/RETROGRADE/URETEROSCOPY Left 02/08/2018   Procedure: CYSTOSCOPY/RETROGRADE/URETEROSCOPY;  Surgeon: Robert Gargus, MD;  Location: [facility] ORS;  Service: Urology;  Laterality: Left;  . CYSTOSCOPY/URETEROSCOPY/HOLMIUM LASER/STENT PLACEMENT Right 03/29/2018   Procedure: CYSTOSCOPY/URETEROSCOPY/HOLMIUM LASER/STENT PLACEMENT;  Surgeon: Robert Vasconcelos, MD;  Location: [facility] ORS;  Service: Urology;  Laterality: Right;  antegrade vs retrograde  . IR CONVERT RIGHT NEPHROSTOMY TO NEPHROURETERAL CATH  03/17/2018  . IR NEPHROSTOMY PLACEMENT RIGHT  02/11/2018    Home Medications:  Allergies as of 03/31/2018      Reactions   Doxycycline Hyclate Rash      Medication List       Accurate as of 03/31/18 12:53 PM. Always use your most recent med list.          aspirin EC 81 MG tablet Take 81 mg by mouth at bedtime.   cephALEXin 500 MG capsule Commonly known as:  KEFLEX Take 1 capsule (500 mg total) by mouth 3 (three) times daily for 7 days.   docusate sodium 100 MG capsule Commonly known as:  COLACE Take 1 capsule (100 mg total) by mouth 2 (two) times daily.   ezetimibe 10 MG tablet Commonly known as:  ZETIA Take 10 mg by mouth at bedtime.   HYDROcodone-acetaminophen 5-325 MG tablet Commonly known as:  NORCO/VICODIN Take 1-2 tablets by mouth every 6 (six) hours as needed for moderate pain.   losartan 50 MG tablet Commonly known as:  COZAAR Take 50 mg by mouth at bedtime.   MULTI-VITAMINS Tabs Take 1 tablet by mouth at bedtime.   oxybutynin 5 MG tablet Commonly known as:  DITROPAN Take 1 tablet (5 mg total) by mouth every 8 (eight) hours as needed for bladder spasms.   rosuvastatin 40 MG tablet Commonly known as:  CRESTOR Take 40 mg by mouth at bedtime.   tamsulosin 0.4 MG Caps capsule Commonly known as:  FLOMAX Take 1 capsule (0.4 mg total) by mouth daily.       Allergies:  Allergies  Allergen Reactions  . Doxycycline Hyclate Rash    Family History: Family History  Problem Relation Age of Onset  . Heart failure Mother   . Lung cancer   Mother     Social History:  reports that he has never smoked. He has never used smokeless tobacco. He reports that he drinks about 2.0 standard drinks of alcohol per week. He reports that he does not use drugs.  ROS: UROLOGY Frequent Urination?: No Hard to postpone urination?: No Burning/pain with urination?: No Get up at night to urinate?: No Leakage of urine?: No Urine stream starts and stops?: No Trouble starting stream?: No Do you have to strain to urinate?: No Blood in urine?: No Urinary tract infection?: Yes Sexually transmitted disease?: No Injury to kidneys or bladder?:  No Painful intercourse?: No Weak stream?: No Erection problems?: No Penile pain?: No  Gastrointestinal Nausea?: No Vomiting?: No Indigestion/heartburn?: No Diarrhea?: No Constipation?: No  Constitutional Fever: Yes Night sweats?: No Weight loss?: No Fatigue?: No  Skin Skin rash/lesions?: No Itching?: No  Eyes Blurred vision?: No Double vision?: No  Ears/Nose/Throat Sore throat?: No Sinus problems?: No  Hematologic/Lymphatic Swollen glands?: No Easy bruising?: No  Cardiovascular Leg swelling?: No Chest pain?: No  Respiratory Cough?: No Shortness of breath?: No  Endocrine Excessive thirst?: No  Musculoskeletal Back pain?: No Joint pain?: No  Neurological Headaches?: No Dizziness?: No  Psychologic Depression?: No Anxiety?: No  Physical Exam: BP (!) 152/107   Pulse (!) 102   Temp 99.3 F (37.4 C)   Constitutional:  Alert and oriented, No acute distress. HEENT: [place] AT, moist mucus membranes.  Trachea midline, no masses. Cardiovascular: No clubbing, cyanosis, or edema. Respiratory: Normal respiratory effort, no increased work of breathing. GI: Abdomen is soft, nontender, nondistended, no abdominal masses GU: No CVA tenderness.  Right flank incision well-healed, no erythema or drainage. Skin: No rashes, bruises or suspicious lesions. Neurologic: Grossly intact, no focal deficits, moving all 4 extremities. Psychiatric: Normal mood and affect.  Laboratory Data: Lab Results  Component Value Date   WBC 9.8 03/31/2018   HGB 13.8 03/31/2018   HCT 40.1 03/31/2018   MCV 89.6 03/31/2018   PLT 273 03/31/2018    Lab Results  Component Value Date   CREATININE 1.39 (H) 03/31/2018   Component     Latest Ref Rng & Units 03/31/2018  Lactic Acid, Venous     0.5 - 1.9 mmol/L 1.5    Urinalysis Results for orders placed or performed in visit on 03/31/18  Microscopic Examination  Result Value Ref Range   WBC, UA >30 (H) 0 - 5 /hpf   RBC, UA >30 (H)  0 - 2 /hpf   Epithelial Cells (non renal) None seen 0 - 10 /hpf   Mucus, UA Present (A) Not Estab.   Bacteria, UA Many (A) None seen/Few  Urinalysis, Complete  Result Value Ref Range   Specific Gravity, UA 1.015 1.005 - 1.030   pH, UA 7.0 5.0 - 7.5   Color, UA Red (A) Yellow   Appearance Ur Cloudy (A) Clear   Leukocytes, UA 3+ (A) Negative   Protein, UA 2+ (A) Negative/Trace   Glucose, UA Negative Negative   Ketones, UA Negative Negative   RBC, UA 3+ (A) Negative   Bilirubin, UA Negative Negative   Urobilinogen, Ur 0.2 0.2 - 1.0 mg/dL   Nitrite, UA Negative Negative   Microscopic Examination See below:     Pertinent Imaging: N/a  Assessment & Plan:    1. Right ureteral stone Status post right ureteroscopy on Monday now with fever to 102 but otherwise hemodynamically stable, normotensive and labs are reassuring (no leukocytosis, normal lactate)  Clinically he   appears well.  As such, he is given a dose of ceftriaxone here with strict precautions to present to the emergency room if he continues to spike high fevers or starts to feel unwell.  Repeat UA/urine culture as well as blood culture obtained today.  He is administered ceftriaxone 1 g in the office today.  He was advised to come back to the office tomorrow if he continues to spike fevers for reassessment or to the emergency room as above.  Follow blood and urine cultures.  Plan for staged ureteroscopic procedure in 2 to 3 weeks to treat residual stone burden as per previously planned.  - Urinalysis, Complete - CBC; Future - Basic metabolic panel; Future - Blood culture; Future - Lactic Acid, Plasma; Future - cefTRIAXone (ROCEPHIN) injection 1 g - CULTURE, URINE COMPREHENSIVE  2. Pyelonephritis As above  Luzelena Heeg, MD  [facility] [address], [address] [place], [place] [address] [phone]  

## 2018-04-01 ENCOUNTER — Telehealth: Payer: Self-pay | Admitting: Urology

## 2018-04-01 NOTE — Telephone Encounter (Signed)
Insurance Authorization for Outpatient Surgery CPT [id number] - Right URS, U, Stent exchange Diag: R ureteral stone DOS: 04/12/2018 Dr. Vanna ScotlandAshley Brandon  [facility] [phone]), [facility], prior authorization is NOT required.  Reference # [id number]  [facility] 04/01/2018

## 2018-04-03 LAB — CULTURE, URINE COMPREHENSIVE

## 2018-04-05 ENCOUNTER — Other Ambulatory Visit: Payer: Medicare Other

## 2018-04-05 ENCOUNTER — Telehealth: Payer: Self-pay

## 2018-04-05 DIAGNOSIS — N201 Calculus of ureter: Secondary | ICD-10-CM

## 2018-04-05 LAB — URINALYSIS, COMPLETE
Bilirubin, UA: NEGATIVE
Glucose, UA: NEGATIVE
KETONES UA: NEGATIVE
Nitrite, UA: NEGATIVE
SPEC GRAV UA: 1.015 (ref 1.005–1.030)
UUROB: 1 mg/dL (ref 0.2–1.0)
pH, UA: 7 (ref 5.0–7.5)

## 2018-04-05 LAB — CULTURE, BLOOD (SINGLE)
CULTURE: NO GROWTH
Special Requests: ADEQUATE

## 2018-04-05 LAB — MICROSCOPIC EXAMINATION: Epithelial Cells (non renal): NONE SEEN /hpf (ref 0–10)

## 2018-04-05 MED ORDER — SULFAMETHOXAZOLE-TRIMETHOPRIM 800-160 MG PO TABS: 1.0000 | ORAL_TABLET | Freq: Two times a day (BID) | ORAL | 0 refills | Status: DC

## 2018-04-05 NOTE — Telephone Encounter (Signed)
-----   Message from Vanna ScotlandAshley Brandon, MD sent at 04/04/2018  4:27 PM EDT ----- Please change antibiotics to bactrim ds bid x 7 days to start ASAP.  Vanna ScotlandAshley Brandon, MD

## 2018-04-05 NOTE — Telephone Encounter (Signed)
Left pt message to notify, script sent to pharm

## 2018-04-06 ENCOUNTER — Other Ambulatory Visit: Payer: Medicare Other | Admitting: Urology

## 2018-04-07 LAB — CULTURE, URINE COMPREHENSIVE

## 2018-04-08 ENCOUNTER — Telehealth: Payer: Self-pay | Admitting: Radiology

## 2018-04-08 NOTE — Telephone Encounter (Signed)
Result Notes for CULTURE, URINE COMPREHENSIVE   Notes recorded by Vanna ScotlandBrandon, Ashley, MD on 04/07/2018 at 6:03 PM EDT Please treat with Bactrim DS twice daily for 7 days in light of upcoming sugery  Vanna ScotlandAshley Brandon, MD    Confirmed with Dr Apolinar JunesBrandon that new script is not needed due to script of Bactrim x 7 days was ordered on 04/05/2018 & course will finish on the day of surgery.

## 2018-04-11 MED ORDER — CEFAZOLIN SODIUM-DEXTROSE 2-4 GM/100ML-% IV SOLN: 2.0000 g | INTRAVENOUS | Status: DC

## 2018-04-12 ENCOUNTER — Ambulatory Visit: Payer: Medicare Other | Admitting: Anesthesiology

## 2018-04-12 ENCOUNTER — Telehealth: Payer: Self-pay | Admitting: Urology

## 2018-04-12 ENCOUNTER — Encounter
Admission: RE | Disposition: A | Discharge status: Home / Self Care | Payer: Self-pay | Source: Ambulatory Visit | Attending: Urology

## 2018-04-12 ENCOUNTER — Encounter: Payer: Self-pay | Admitting: *Deleted

## 2018-04-12 ENCOUNTER — Ambulatory Visit
Admission: RE | Admit: 2018-04-12 | Discharge: 2018-04-12 | Disposition: A | Discharge status: Home / Self Care | Payer: Medicare Other | Source: Ambulatory Visit | Attending: Urology | Admitting: Urology

## 2018-04-12 DIAGNOSIS — I2510 Atherosclerotic heart disease of native coronary artery without angina pectoris: Secondary | ICD-10-CM | POA: Insufficient documentation

## 2018-04-12 DIAGNOSIS — I739 Peripheral vascular disease, unspecified: Secondary | ICD-10-CM | POA: Diagnosis not present

## 2018-04-12 DIAGNOSIS — N132 Hydronephrosis with renal and ureteral calculous obstruction: Secondary | ICD-10-CM

## 2018-04-12 DIAGNOSIS — Z881 Allergy status to other antibiotic agents status: Secondary | ICD-10-CM | POA: Diagnosis not present

## 2018-04-12 DIAGNOSIS — I10 Essential (primary) hypertension: Secondary | ICD-10-CM | POA: Diagnosis not present

## 2018-04-12 DIAGNOSIS — E785 Hyperlipidemia, unspecified: Secondary | ICD-10-CM | POA: Insufficient documentation

## 2018-04-12 DIAGNOSIS — N201 Calculus of ureter: Secondary | ICD-10-CM

## 2018-04-12 DIAGNOSIS — Z79899 Other long term (current) drug therapy: Secondary | ICD-10-CM | POA: Diagnosis not present

## 2018-04-12 DIAGNOSIS — Z951 Presence of aortocoronary bypass graft: Secondary | ICD-10-CM | POA: Insufficient documentation

## 2018-04-12 DIAGNOSIS — Z7982 Long term (current) use of aspirin: Secondary | ICD-10-CM | POA: Diagnosis not present

## 2018-04-12 DIAGNOSIS — N136 Pyonephrosis: Secondary | ICD-10-CM | POA: Diagnosis not present

## 2018-04-12 DIAGNOSIS — I252 Old myocardial infarction: Secondary | ICD-10-CM | POA: Diagnosis not present

## 2018-04-12 HISTORY — PX: CYSTOSCOPY/URETEROSCOPY/HOLMIUM LASER/STENT PLACEMENT: SHX6546

## 2018-04-12 SURGERY — CYSTOSCOPY/URETEROSCOPY/HOLMIUM LASER/STENT PLACEMENT
Anesthesia: General | Site: Ureter | Laterality: Right

## 2018-04-12 MED ORDER — DEXAMETHASONE SODIUM PHOSPHATE 10 MG/ML IJ SOLN
INTRAMUSCULAR | Status: DC | PRN
  Administered 2018-04-12: 10 mg via INTRAVENOUS

## 2018-04-12 MED ORDER — MIDAZOLAM HCL 2 MG/2ML IJ SOLN
INTRAMUSCULAR | Status: AC
  Filled 2018-04-12: qty 2

## 2018-04-12 MED ORDER — SODIUM CHLORIDE 0.9 % IV SOLN
1.0000 g | INTRAVENOUS | Status: AC
  Administered 2018-04-12: 1 g via INTRAVENOUS
  Filled 2018-04-12: qty 1

## 2018-04-12 MED ORDER — SODIUM CHLORIDE 0.9 % IV SOLN: INTRAVENOUS | Status: DC

## 2018-04-12 MED ORDER — ONDANSETRON HCL 4 MG/2ML IJ SOLN
INTRAMUSCULAR | Status: DC | PRN
  Administered 2018-04-12 (×2): 4 mg via INTRAVENOUS

## 2018-04-12 MED ORDER — FAMOTIDINE 20 MG PO TABS
20.0000 mg | ORAL_TABLET | Freq: Once | ORAL | Status: AC
  Administered 2018-04-12: 20 mg via ORAL

## 2018-04-12 MED ORDER — FENTANYL CITRATE (PF) 100 MCG/2ML IJ SOLN
INTRAMUSCULAR | Status: DC | PRN
  Administered 2018-04-12: 25 ug via INTRAVENOUS
  Administered 2018-04-12: 50 ug via INTRAVENOUS
  Administered 2018-04-12 (×3): 25 ug via INTRAVENOUS

## 2018-04-12 MED ORDER — LACTATED RINGERS IV SOLN
INTRAVENOUS | Status: DC
  Administered 2018-04-12: 11:00:00 via INTRAVENOUS

## 2018-04-12 MED ORDER — IOPAMIDOL (ISOVUE-200) INJECTION 41%
INTRAVENOUS | Status: DC | PRN
  Administered 2018-04-12: 60 mL

## 2018-04-12 MED ORDER — FAMOTIDINE 20 MG PO TABS
ORAL_TABLET | ORAL | Status: AC
  Administered 2018-04-12: 20 mg via ORAL
  Filled 2018-04-12: qty 1

## 2018-04-12 MED ORDER — ARMC OPHTHALMIC DILATING DROPS
OPHTHALMIC | Status: AC
  Filled 2018-04-12: qty 0.5

## 2018-04-12 MED ORDER — PROPOFOL 10 MG/ML IV BOLUS
INTRAVENOUS | Status: DC | PRN
  Administered 2018-04-12: 20 mg via INTRAVENOUS
  Administered 2018-04-12: 180 mg via INTRAVENOUS

## 2018-04-12 MED ORDER — MIDAZOLAM HCL 2 MG/2ML IJ SOLN
INTRAMUSCULAR | Status: DC | PRN
  Administered 2018-04-12: 2 mg via INTRAVENOUS

## 2018-04-12 MED ORDER — LACTATED RINGERS IV SOLN
INTRAVENOUS | Status: DC | PRN
  Administered 2018-04-12 (×2): via INTRAVENOUS

## 2018-04-12 MED ORDER — PHENYLEPHRINE HCL 10 MG/ML IJ SOLN
INTRAMUSCULAR | Status: DC | PRN
  Administered 2018-04-12 (×4): 100 ug via INTRAVENOUS

## 2018-04-12 MED ORDER — CEFAZOLIN SODIUM-DEXTROSE 2-4 GM/100ML-% IV SOLN
INTRAVENOUS | Status: AC
  Filled 2018-04-12: qty 100

## 2018-04-12 MED ORDER — FENTANYL CITRATE (PF) 100 MCG/2ML IJ SOLN: 25.0000 ug | INTRAMUSCULAR | Status: DC | PRN

## 2018-04-12 MED ORDER — OXYCODONE HCL 5 MG/5ML PO SOLN: 5.0000 mg | Freq: Once | ORAL | Status: DC | PRN

## 2018-04-12 MED ORDER — FENTANYL CITRATE (PF) 100 MCG/2ML IJ SOLN
INTRAMUSCULAR | Status: AC
  Filled 2018-04-12: qty 2

## 2018-04-12 MED ORDER — LIDOCAINE HCL (CARDIAC) PF 100 MG/5ML IV SOSY
PREFILLED_SYRINGE | INTRAVENOUS | Status: DC | PRN
  Administered 2018-04-12: 100 mg via INTRAVENOUS

## 2018-04-12 MED ORDER — OXYCODONE HCL 5 MG PO TABS: 5.0000 mg | ORAL_TABLET | Freq: Once | ORAL | Status: DC | PRN

## 2018-04-12 SURGICAL SUPPLY — 31 items
BAG DRAIN CYSTO-URO LG1000N (MISCELLANEOUS) ×3 IMPLANT
BASKET ZERO TIP 1.9FR (BASKET) ×2 IMPLANT
BRUSH SCRUB EZ 1% IODOPHOR (MISCELLANEOUS) ×3 IMPLANT
BSKT STON RTRVL ZERO TP 1.9FR (BASKET) ×1
CATH URETL 5X70 OPEN END (CATHETERS) ×3 IMPLANT
CNTNR SPEC 2.5X3XGRAD LEK (MISCELLANEOUS) ×1
CONT SPEC 4OZ STER OR WHT (MISCELLANEOUS) ×2
CONT SPEC 4OZ STRL OR WHT (MISCELLANEOUS) ×1
CONTAINER SPEC 2.5X3XGRAD LEK (MISCELLANEOUS) IMPLANT
DRAPE UTILITY 15X26 TOWEL STRL (DRAPES) ×3 IMPLANT
FIBER LASER LITHO 273 (Laser) ×2 IMPLANT
GLOVE BIO SURGEON STRL SZ 6.5 (GLOVE) ×2 IMPLANT
GLOVE BIO SURGEONS STRL SZ 6.5 (GLOVE) ×1
GOWN STRL REUS W/ TWL LRG LVL3 (GOWN DISPOSABLE) ×2 IMPLANT
GOWN STRL REUS W/TWL LRG LVL3 (GOWN DISPOSABLE) ×6
GUIDEWIRE GREEN .038 145CM (MISCELLANEOUS) ×2 IMPLANT
INFUSOR MANOMETER BAG 3000ML (MISCELLANEOUS) ×3 IMPLANT
INTRODUCER DILATOR DOUBLE (INTRODUCER) ×2 IMPLANT
KIT TURNOVER CYSTO (KITS) ×3 IMPLANT
PACK CYSTO AR (MISCELLANEOUS) ×3 IMPLANT
SENSORWIRE 0.038 NOT ANGLED (WIRE) ×6
SET CYSTO W/LG BORE CLAMP LF (SET/KITS/TRAYS/PACK) ×3 IMPLANT
SHEATH URETERAL 12FRX35CM (MISCELLANEOUS) IMPLANT
SOL .9 NS 3000ML IRR  AL (IV SOLUTION) ×2
SOL .9 NS 3000ML IRR AL (IV SOLUTION) ×1
SOL .9 NS 3000ML IRR UROMATIC (IV SOLUTION) ×1 IMPLANT
STENT URET 6FRX24 CONTOUR (STENTS) IMPLANT
STENT URET 6FRX26 CONTOUR (STENTS) IMPLANT
SURGILUBE 2OZ TUBE FLIPTOP (MISCELLANEOUS) ×3 IMPLANT
WATER STERILE IRR 1000ML POUR (IV SOLUTION) ×3 IMPLANT
WIRE SENSOR 0.038 NOT ANGLED (WIRE) ×1 IMPLANT

## 2018-04-12 NOTE — Discharge Instructions (Addendum)
You have a ureteral stent in place.  This is a tube that extends from your kidney to your bladder.  This may cause urinary bleeding, burning with urination, and urinary frequency.  Please call our office or present to the ED if you develop fevers >101 or pain which is not able to be controlled with oral pain medications.  You may be given either Flomax and/ or ditropan to help with bladder spasms and stent pain in addition to pain medications.   ° °[facility] °[address], [address] °[place], [place] [address] °[phone] ° ° ° °AMBULATORY SURGERY  °DISCHARGE INSTRUCTIONS ° ° °1) The drugs that you were given will stay in your system until tomorrow so for the next 24 hours you should not: ° °A) Drive an automobile °B) Make any legal decisions °C) Drink any alcoholic beverage ° ° °2) You may resume regular meals tomorrow.  Today it is better to start with liquids and gradually work up to solid foods. ° °You may eat anything you prefer, but it is better to start with liquids, then soup and crackers, and gradually work up to solid foods. ° ° °3) Please notify your doctor immediately if you have any unusual bleeding, trouble breathing, redness and pain at the surgery site, drainage, fever, or pain not relieved by medication. ° ° ° °4) Additional Instructions: ° ° ° ° ° ° ° °Please contact your physician with any problems or Same Day Surgery at [phone], Monday through Friday 6 am to 4 pm, or [place] [facility] [place] [facility] number at [phone]. °

## 2018-04-12 NOTE — Telephone Encounter (Signed)
I would like him to keep his stent for 1 month.  Please change stent removal to 1 month from now.  Vanna ScotlandAshley Vinisha Faxon, MD

## 2018-04-12 NOTE — Anesthesia Procedure Notes (Signed)
Procedure Name: LMA Insertion Date/Time: 04/12/2018 12:10 PM Performed by: Junious SilkNoles, Marijah Larranaga, CRNA Pre-anesthesia Checklist: Patient identified, Patient being monitored, Timeout performed, Emergency Drugs available and Suction available Patient Re-evaluated:Patient Re-evaluated prior to induction Oxygen Delivery Method: Circle system utilized Preoxygenation: Pre-oxygenation with 100% oxygen Induction Type: IV induction Ventilation: Mask ventilation without difficulty LMA: LMA inserted LMA Size: 4.5 Grade View: Grade II Tube type: Oral Number of attempts: 1 Placement Confirmation: positive ETCO2 and breath sounds checked- equal and bilateral Tube secured with: Tape Dental Injury: Teeth and Oropharynx as per pre-operative assessment

## 2018-04-12 NOTE — Anesthesia Preprocedure Evaluation (Addendum)
Anesthesia Evaluation  Patient identified by MRN, date of birth, ID band Patient awake    Reviewed: Allergy & Precautions, H&P , NPO status , Patient's Chart, lab work & pertinent test results, reviewed documented beta blocker date and time   Airway Mallampati: III  TM Distance: >3 FB Neck ROM: full    Dental  (+) Teeth Intact   Pulmonary neg pulmonary ROS, neg COPD,    Pulmonary exam normal breath sounds clear to auscultation       Cardiovascular hypertension, + CAD, + Past MI, + CABG and + Peripheral Vascular Disease  Normal cardiovascular exam Rhythm:regular Rate:Normal     Neuro/Psych negative neurological ROS  negative psych ROS   GI/Hepatic negative GI ROS, Neg liver ROS, neg GERD  ,  Endo/Other  negative endocrine ROS  Renal/GU Renal disease     Musculoskeletal   Abdominal   Peds  Hematology negative hematology ROS (+)   Anesthesia Other Findings Past Medical History: No date: CAD (coronary artery disease) No date: History of kidney stones No date: Hyperlipemia No date: Hypertension No date: MI (myocardial infarction) (HCC) Past Surgical History: No date: CARDIAC CATHETERIZATION 2004: CORONARY ARTERY BYPASS GRAFT     Comment:  5 vessel 02/08/2018: CYSTOSCOPY/RETROGRADE/URETEROSCOPY; Left     Comment:  Procedure: CYSTOSCOPY/RETROGRADE/URETEROSCOPY;  Surgeon:              Vanna ScotlandBrandon, Ashley, MD;  Location: [facility] ORS;  Service:               Urology;  Laterality: Left; 03/17/2018: IR CONVERT RIGHT NEPHROSTOMY TO NEPHROURETERAL CATH 02/11/2018: IR NEPHROSTOMY PLACEMENT RIGHT   Reproductive/Obstetrics negative OB ROS                           Anesthesia Physical  Anesthesia Plan  ASA: III  Anesthesia Plan: General LMA   Post-op Pain Management:    Induction: Intravenous  PONV Risk Score and Plan: Ondansetron, Treatment may vary due to age or medical condition and  Dexamethasone  Airway Management Planned: LMA  Additional Equipment:   Intra-op Plan:   Post-operative Plan: Extubation in OR  Informed Consent: I have reviewed the patients History and Physical, chart, labs and discussed the procedure including the risks, benefits and alternatives for the proposed anesthesia with the patient or authorized representative who has indicated his/her understanding and acceptance.   Dental Advisory Given  Plan Discussed with: CRNA and Anesthesiologist  Anesthesia Plan Comments:        Anesthesia Quick Evaluation

## 2018-04-12 NOTE — Op Note (Signed)
Date of procedure: 04/12/18  Preoperative diagnosis:  1. Impacted right mid ureteral stone 2. Right hydronephrosis  Postoperative diagnosis:  1. Same as above  Procedure: 1. Right ureteroscopy 2. Laser lithotripsy 3. Right ureteral stent exchange 4. Right retrograde pyelogram  Surgeon: Vanna ScotlandAshley Basel Defalco, MD  Anesthesia: General  Complications: None  Intraoperative findings: Residual highly impacted ureteral stone at the level of the iliacs treated, ragged appearing ureter.  Persistent hydroureteronephrosis at the end of procedure.  No perforation or extravasation appreciated.  All visible stone fragment removed from wall of the ureter today.  EBL: Minimal  Specimens: Stone fragment  Drains: 7 x 26 French double-J ureteral stent on right  Indication: Si Robert Fields is a 67 y.o. patient with highly impacted right ureteral calculus at the level of the iliacs he returns again today for staged procedure to treat his residual stone burden.  After reviewing the management options for treatment, he elected to proceed with the above surgical procedure(s). We have discussed the potential benefits and risks of the procedure, side effects of the proposed treatment, the likelihood of the patient achieving the goals of the procedure, and any potential problems that might occur during the procedure or recuperation. Informed consent has been obtained.  Description of procedure:  The patient was taken to the operating room and general anesthesia was induced.  The patient was placed in the dorsal lithotomy position, prepped and draped in the usual sterile fashion, and preoperative antibiotics were administered. A preoperative time-out was performed.   A 21 French cystoscope was advanced per urethra into the bladder.  Attention was turned to the right ureteral orifice from which a ureteral stent was seen emanating.  Stent graspers were used to grasp the distal coil of the stent was just brought to  level the urethral meatus.  The stent was then cannulated using a sensor wire up to level of the kidney.  The wire was left in place and the stent was removed.  A semirigid 4.5 French semirigid ureteroscope was then advanced alongside the wire up to level of the stone.  Of note, there is persistent severe bullous edema and narrowing at this level.  I had difficulty advancing the scope and had to use a railroad technique using a second wire to traverse the impacted stone.  The scope was then backed down the length of the ureter.  A 273 m laser fiber was then brought in and using the settings of initially 0.8 and 10 and later on dusting settings of 0.2 and 40 Hz, the stone was carefully fragmented.  Ultimately, due to the angulation and the heavy impaction in the sidewall of the ureter, I ended up having to use a flexible ureteroscope to achieve the angle.   There is a fair amount of stone within the wall of the ureter and the wall itself had a fairly significant leave ragged appearance secondary to trauma from the impacted stone.  Ultimately, I was able to fragment and remove all visible stone in the wall of the ureter which was relatively challenging.  The scope was then passed beyond the level of the stone and only few small fragments remained.  The ureter itself was edematous both of the level of the stone and beyond.  Throughout the procedure, multiple retrograde pyelograms were performed in order to ensure that there was no extravasation of contrast or ureteral perforation throughout the procedure.  At the end, the ureter remained completely intact.  At the beginning of the case, retrograde  pyelogram showed persistent ureteral obstruction from the stone with minimal contrast above the level of the stone.  At the end, this is a more patent but did require relatively high pressures to overcome the amount of edema at the level of the previously impacted stone.  Finally, the safety wire was backloaded over rigid  cystoscope.  A 7 x 26 French double-J ureteral stent was advanced to level the kidney.  The wires partially drawn until focal stone within the renal pelvis.  The wires and fully withdrawn a full coils noted within the bladder.  The bladder was then drained.  Stone fragments were sent off for stone analysis.  The patient was then clean and dry, repositioned in supine position, reversed anesthesia, taken to PACU in stable condition.  There were no complications in this case.  Plan: Plan to leave the stent in for at least 4 weeks due to the amount of ureteral manipulation, the appearance of the ureter at the level of the highly impacted stone to allow for healing.  I did inform the patient and his wife today that he is at high risk for ureteral stricture at this level.  All questions were answered.  Vanna Scotland, M.D.

## 2018-04-12 NOTE — Anesthesia Postprocedure Evaluation (Signed)
Anesthesia Post Note  Patient: Robert RaiderSamuel K Ashland  Procedure(s) Performed: CYSTOSCOPY/URETEROSCOPY/HOLMIUM LASER/STENT EXCHANGE (Right Ureter)  Patient location during evaluation: PACU Anesthesia Type: General Level of consciousness: awake and alert Pain management: pain level controlled Vital Signs Assessment: post-procedure vital signs reviewed and stable Respiratory status: spontaneous breathing, nonlabored ventilation, respiratory function stable and patient connected to nasal cannula oxygen Cardiovascular status: blood pressure returned to baseline and stable Postop Assessment: no apparent nausea or vomiting Anesthetic complications: no     Last Vitals:  Vitals:   04/12/18 1449 04/12/18 1454  BP: (!) 147/87 (!) 152/69  Pulse: (!) 59 60  Resp: 14 18  Temp: (!) 36.1 C (!) 35.9 C  SpO2: 98% 96%    Last Pain:  Vitals:   04/12/18 1454  TempSrc: Temporal  PainSc: 0-No pain                 Cleda MccreedyJoseph K Piscitello

## 2018-04-12 NOTE — Anesthesia Post-op Follow-up Note (Signed)
Anesthesia QCDR form completed.        

## 2018-04-12 NOTE — Transfer of Care (Signed)
Immediate Anesthesia Transfer of Care Note  Patient: Robert Fields  Procedure(s) Performed: CYSTOSCOPY/URETEROSCOPY/HOLMIUM LASER/STENT EXCHANGE (Right Ureter)  Patient Location: PACU  Anesthesia Type:General  Level of Consciousness: sedated  Airway & Oxygen Therapy: Patient Spontanous Breathing and Patient connected to face mask oxygen  Post-op Assessment: Report given to RN and Post -op Vital signs reviewed and stable  Post vital signs: Reviewed and stable  Last Vitals:  Vitals Value Taken Time  BP    Temp    Pulse 54 04/12/2018  2:19 PM  Resp 12 04/12/2018  2:19 PM  SpO2 100 % 04/12/2018  2:19 PM  Vitals shown include unvalidated device data.  Last Pain:  Vitals:   04/12/18 1114  TempSrc: Temporal  PainSc: 0-No pain         Complications: No apparent anesthesia complications

## 2018-04-12 NOTE — Interval H&P Note (Signed)
History and Physical Interval Note:  04/12/2018 11:11 AM  Robert Fields  has presented today for surgery, with the diagnosis of right ureteral stone  The various methods of treatment have been discussed with the patient and family. After consideration of risks, benefits and other options for treatment, the patient has consented to  Procedure(s) with comments: CYSTOSCOPY/URETEROSCOPY/HOLMIUM LASER/STENT EXCHANGE (Right) - antegrade vs retrograde as a surgical intervention .  The patient's history has been reviewed, patient examined, no change in status, stable for surgery.  I have reviewed the patient's chart and labs.  Questions were answered to the patient's satisfaction.    RRR CTAB  Returns today for staged procedure to treat residual stone burden  +UCx with Serratia currently on Bactrim  [name]

## 2018-04-12 NOTE — Telephone Encounter (Signed)
Recovery called to make this pt a 1 month stent removal appt, I noticed this pt has cysto/stent removal next week Oct 1, I asked Amy which one would be needed, next week or 1 month, Amy advised to keep the appt for next week Oct 1 for the cysto/stent removal, is this okay? Or do you want it moved out 1 month

## 2018-04-12 NOTE — Telephone Encounter (Signed)
App has been changed   MB

## 2018-04-13 ENCOUNTER — Encounter: Payer: Self-pay | Admitting: Urology

## 2018-04-17 LAB — STONE ANALYSIS
CA OXALATE, DIHYDRATE: 45 %
CA OXALATE, MONOHYDR.: 40 %
Ca phos cry stone ql IR: 15 %
Stone Weight KSTONE: 42.6 mg

## 2018-04-20 ENCOUNTER — Other Ambulatory Visit: Payer: Self-pay | Admitting: Family Medicine

## 2018-04-20 ENCOUNTER — Other Ambulatory Visit: Payer: Medicare Other | Admitting: Urology

## 2018-04-20 MED ORDER — TAMSULOSIN HCL 0.4 MG PO CAPS: 0.4000 mg | ORAL_CAPSULE | Freq: Every day | ORAL | 6 refills | Status: DC

## 2018-05-11 ENCOUNTER — Ambulatory Visit (INDEPENDENT_AMBULATORY_CARE_PROVIDER_SITE_OTHER): Payer: Medicare Other | Admitting: Urology

## 2018-05-11 DIAGNOSIS — N201 Calculus of ureter: Secondary | ICD-10-CM

## 2018-05-11 LAB — URINALYSIS, COMPLETE
Bilirubin, UA: NEGATIVE
GLUCOSE, UA: NEGATIVE
Ketones, UA: NEGATIVE
Nitrite, UA: POSITIVE — AB
SPEC GRAV UA: 1.015 (ref 1.005–1.030)
UUROB: 0.2 mg/dL (ref 0.2–1.0)
pH, UA: 7 (ref 5.0–7.5)

## 2018-05-11 LAB — MICROSCOPIC EXAMINATION: Epithelial Cells (non renal): NONE SEEN /hpf (ref 0–10)

## 2018-05-11 MED ORDER — SULFAMETHOXAZOLE-TRIMETHOPRIM 800-160 MG PO TABS: 1.0000 | ORAL_TABLET | Freq: Two times a day (BID) | ORAL | 0 refills | Status: DC

## 2018-05-11 NOTE — Progress Notes (Signed)
Patient presented today for cystoscopy, stent removal, however his urine is frankly positive including positive nitrites.  As such, we will start him on Bactrim today, follow-up urine culture and adjust as needed.    Plan to reschedule cystoscopy, stent removal for next week.   Vanna Scotland, MD

## 2018-05-17 LAB — CULTURE, URINE COMPREHENSIVE

## 2018-05-18 ENCOUNTER — Other Ambulatory Visit: Payer: Medicare Other | Admitting: Urology

## 2018-05-18 ENCOUNTER — Telehealth: Payer: Self-pay | Admitting: Family Medicine

## 2018-05-18 MED ORDER — CEPHALEXIN 500 MG PO CAPS: 500.0000 mg | ORAL_CAPSULE | Freq: Four times a day (QID) | ORAL | 0 refills | Status: DC

## 2018-05-18 NOTE — Telephone Encounter (Signed)
Patient notified and appointment cancelled. He is waiting to see when to come get stent taken out

## 2018-05-18 NOTE — Telephone Encounter (Signed)
-----   Message from Vanna Scotland, MD sent at 05/18/2018  8:36 AM EDT ----- Please change abx to Keflex 500 mg qid x 7 days.  Please start ASAP.  Vanna Scotland, MD

## 2018-05-21 ENCOUNTER — Ambulatory Visit (INDEPENDENT_AMBULATORY_CARE_PROVIDER_SITE_OTHER): Payer: Medicare Other | Admitting: Urology

## 2018-05-21 ENCOUNTER — Encounter: Payer: Self-pay | Admitting: Urology

## 2018-05-21 VITALS — BP 138/83 | HR 64 | Ht 73.0 in | Wt 225.0 lb

## 2018-05-21 DIAGNOSIS — N201 Calculus of ureter: Secondary | ICD-10-CM

## 2018-05-21 DIAGNOSIS — Z9889 Other specified postprocedural states: Secondary | ICD-10-CM | POA: Diagnosis not present

## 2018-05-21 LAB — URINALYSIS, COMPLETE
BILIRUBIN UA: NEGATIVE
Glucose, UA: NEGATIVE
Ketones, UA: NEGATIVE
Nitrite, UA: NEGATIVE
PH UA: 7 (ref 5.0–7.5)
Protein, UA: NEGATIVE
Specific Gravity, UA: 1.01 (ref 1.005–1.030)
UUROB: 0.2 mg/dL (ref 0.2–1.0)

## 2018-05-21 LAB — MICROSCOPIC EXAMINATION: EPITHELIAL CELLS (NON RENAL): NONE SEEN /HPF (ref 0–10)

## 2018-05-21 NOTE — Progress Notes (Signed)
Indications: Patient is 67 y.o., male status post right ureteroscopy for an impacted mid ureteral stone by Dr. Apolinar Junes on 04/12/2018.  He was scheduled for stent removal last week however had bacteriuria.  He remains on Keflex and has no complaints.  Urinalysis today without evidence of infection.  The patient is presenting today for stent removal.  Procedure:  Flexible Cystoscopy with stent removal (09604)  Timeout was performed and the correct patient, procedure and participants were identified.    Description:  The patient was prepped and draped in the usual sterile fashion. Flexible cystosopy was performed.  The stent was visualized, grasped, and removed intact without difficulty. The patient tolerated the procedure well.    Complications:  None  Plan: Will have him follow-up with Dr. Apolinar Junes in approximately 2 months with a renal ultrasound prior to that visit.

## 2018-07-23 ENCOUNTER — Ambulatory Visit
Admission: RE | Admit: 2018-07-23 | Discharge: 2018-07-23 | Disposition: A | Discharge status: Home / Self Care | Payer: Medicare Other | Source: Ambulatory Visit | Attending: Urology | Admitting: Urology

## 2018-07-23 DIAGNOSIS — N201 Calculus of ureter: Secondary | ICD-10-CM | POA: Diagnosis present

## 2018-07-27 ENCOUNTER — Ambulatory Visit: Payer: Medicare Other | Admitting: Urology

## 2018-07-27 ENCOUNTER — Encounter: Payer: Self-pay | Admitting: Urology

## 2018-07-27 VITALS — BP 126/81 | HR 64 | Ht 72.0 in | Wt 225.0 lb

## 2018-07-27 DIAGNOSIS — N201 Calculus of ureter: Secondary | ICD-10-CM | POA: Diagnosis not present

## 2018-07-27 DIAGNOSIS — N1339 Other hydronephrosis: Secondary | ICD-10-CM

## 2018-07-27 DIAGNOSIS — N200 Calculus of kidney: Secondary | ICD-10-CM

## 2018-07-27 NOTE — Progress Notes (Signed)
07/27/2018  2:51 PM   Robert Fields April 11, 1951 [id number]  Referring provider: Lynnea Ferrier, MD [address] [facility] [place], [place] [address]  Chief Complaint  Patient presents with  . Right Uretal Stone    HPI: Robert Fields is a 68 y.o. male with complex GU history including history of impacted right ureteral stone with chronic right renal atrophy/severe hydronephrosis status post staged ureteroscopy as well as nonobstructing left-sided stone.  He returns today with follow-up renal ultrasound which shows now only mild right hydronephrosis, dramatically improved from preop.  He does have a nonobstructing left-sided stone is seen on previous scans.  Initally presented with obstructing 13 mm right ureteral calculus with severe hydroureteronephrosis and atrophy at the level of the iliacs. He has underwent multiple procedures for above including anograde placement of ureteral stent. His last procedure was a ureteroscopy on 9/19 for his highly impacted right ureteral stone. Post op course complicated by bacteruria, ultimately stent was removed on 10/19.  Further treatmeant of stone included a Lasix renogram indicating decreased function of his right kidney at 26.5% compared to 73% on the left for split function.  He denies any urological symptoms.  No flank pain, urinary symptoms, gross hematuria, or any other symptoms today.  He is overall pleased.  Notably, he was asymptomatic with high-grade obstruction in the past.  PMH: Past Medical History:  Diagnosis Date  . CAD (coronary artery disease)   . History of kidney stones   . Hyperlipemia   . Hypertension   . MI (myocardial infarction) [facility])     Surgical History: Past Surgical History:  Procedure Laterality Date  . CARDIAC CATHETERIZATION    . CORONARY ARTERY BYPASS GRAFT  2004   5 vessel  . CYSTOSCOPY/RETROGRADE/URETEROSCOPY Left 02/08/2018   Procedure: CYSTOSCOPY/RETROGRADE/URETEROSCOPY;  Surgeon:  Vanna Scotland, MD;  Location: [facility] ORS;  Service: Urology;  Laterality: Left;  . CYSTOSCOPY/URETEROSCOPY/HOLMIUM LASER/STENT PLACEMENT Right 03/29/2018   Procedure: CYSTOSCOPY/URETEROSCOPY/HOLMIUM LASER/STENT PLACEMENT;  Surgeon: Vanna Scotland, MD;  Location: [facility] ORS;  Service: Urology;  Laterality: Right;  antegrade vs retrograde  . CYSTOSCOPY/URETEROSCOPY/HOLMIUM LASER/STENT PLACEMENT Right 04/12/2018   Procedure: CYSTOSCOPY/URETEROSCOPY/HOLMIUM LASER/STENT EXCHANGE;  Surgeon: Vanna Scotland, MD;  Location: [facility] ORS;  Service: Urology;  Laterality: Right;  antegrade vs retrograde  . IR CONVERT RIGHT NEPHROSTOMY TO NEPHROURETERAL CATH  03/17/2018  . IR NEPHROSTOMY PLACEMENT RIGHT  02/11/2018    Home Medications:  Allergies as of 07/27/2018      Reactions   Doxycycline Hyclate Rash      Medication List       Accurate as of July 27, 2018  2:51 PM. Always use your most recent med list.        aspirin EC 81 MG tablet Take 81 mg by mouth at bedtime.   ezetimibe 10 MG tablet Commonly known as:  ZETIA Take 10 mg by mouth at bedtime.   losartan 50 MG tablet Commonly known as:  COZAAR Take 50 mg by mouth at bedtime.   MULTI-VITAMINS Tabs Take 1 tablet by mouth at bedtime.   rosuvastatin 40 MG tablet Commonly known as:  CRESTOR Take 40 mg by mouth at bedtime.       Allergies:  Allergies  Allergen Reactions  . Doxycycline Hyclate Rash    Family History: Family History  Problem Relation Age of Onset  . Heart failure Mother   . Lung cancer Mother     Social History:  reports that he has never smoked. He  has never used smokeless tobacco. He reports current alcohol use of about 2.0 standard drinks of alcohol per week. He reports that he does not use drugs.  ROS: UROLOGY Frequent Urination?: No Hard to postpone urination?: No Burning/pain with urination?: No Get up at night to urinate?: No Leakage of urine?: No Urine stream starts and stops?: No Trouble starting  stream?: No Do you have to strain to urinate?: No Blood in urine?: No Urinary tract infection?: No Sexually transmitted disease?: No Injury to kidneys or bladder?: No Painful intercourse?: No Weak stream?: No Erection problems?: No Penile pain?: No  Gastrointestinal Nausea?: No Vomiting?: No Indigestion/heartburn?: No Diarrhea?: No Constipation?: No  Constitutional Fever: No Night sweats?: No Weight loss?: No Fatigue?: No  Skin Skin rash/lesions?: No Itching?: No  Eyes Blurred vision?: No Double vision?: No  Ears/Nose/Throat Sore throat?: No Sinus problems?: No  Hematologic/Lymphatic Swollen glands?: No Easy bruising?: No  Cardiovascular Leg swelling?: No Chest pain?: No  Respiratory Cough?: No Shortness of breath?: No  Endocrine Excessive thirst?: No  Musculoskeletal Back pain?: No Joint pain?: No  Neurological Headaches?: No Dizziness?: No  Psychologic Depression?: No Anxiety?: No  Physical Exam: BP 126/81 (BP Location: Left Arm, Patient Position: Sitting)   Pulse 64   Ht 6' (1.829 m)   Wt 225 lb (102.1 kg)   BMI 30.52 kg/m   Constitutional:  Alert and oriented, No acute distress. Respiratory: Normal respiratory effort, no increased work of breathing. GU: No CVA tenderness Skin: No rashes, bruises or suspicious lesions. Neurologic: Grossly intact, no focal deficits, moving all 4 extremities. Psychiatric: Normal mood and affect.  Pertinent Imaging: CLINICAL DATA:  RIGHT ureteral calculus post ureteroscopy, hydronephrosis, follow-up; history kidney stones, coronary artery disease post MI, hypertension  EXAM: RENAL / URINARY TRACT ULTRASOUND COMPLETE  COMPARISON:  CT abdomen and pelvis 01/22/2018  FINDINGS: Right Kidney:  Renal measurements: 12.0 x 5.6 x 4.6 cm = volume: 158.6 mL. Cortical thinning. Question mild cortical scarring at inferior pole. Normal cortical echogenicity. Mild dilatation of renal pelvis  and major/minor calices, persists after voiding. No definite renal mass or shadowing calcification.  Left Kidney:  Renal measurements: 13.1 x 6.9 x 6.3 cm = volume: 296 mL. Mild cortical thinning. Upper normal cortical echogenicity. Shadowing echogenic focus at inferior pole 11 mm diameter suspect calculus, on prior CT was located at the upper pole. No mass or hydronephrosis.  Bladder:  Appears normal for degree of bladder distention. BILATERAL ureteral jets visualized.  Prostate gland appears mildly enlarged at 3.9 x 3.1 x 4.4 cm.  IMPRESSION: BILATERAL renal cortical atrophy.  Mild RIGHT hydronephrosis.  Probable nonobstructing 11 mm calculus at inferior pole LEFT kidney.   Electronically Signed   By: Ulyses SouthwardMark  Boles M.D.   On: 07/23/2018 09:06  I have personally reviewed the images today.  Assessment & Plan:    1. Right Ureteral Stone S/p ureteroscopy x multiple for highly impacted right ureteral stone See #3 below  2. Recurrent Stone disease Persistent 11mm calculus at interior pole of kidney remains on RUS 07/23/2018  We discussed various treatment options including ESWL vs. ureteroscopy, laser lithotripsy, and stent. We discussed the risks and benefits of both including bleeding, infection, damage to surrounding structures, efficacy with need for possible further intervention, and need for temporary ureteral stent. Explained that based on his anatomy and history it could indicate another staged procedure.  At this point time, he would like to defer further treatment and take a break from his stones.  If  he develops any symptoms on the left in the interim, will return sooner.  He may consider treatment down the road.  We discussed general stone prevention techniques including drinking plenty water with goal of producing 2.5 L urine daily, increased citric acid intake, avoidance of high oxalate containing foods, and decreased salt intake.  Information about dietary  recommendations given today.    3. Right hydronephrosis Likely chronic Improved since ureteroscopy as evidenced by RUS (07/2018) Will continue to follow with [place] surveillance Alternatives including repeat Lasix renogram were also discussed, however, given dramatic improvement from preop, I do not suspect ongoing obstruction although this cannot be ruled out entirely At this time he would prefer conservative management We will follow with renal ultrasound in 6 months which is reasonable  Return in about 6 months (around 01/25/2019), or if symptoms worsen or fail to improve, for RUS.  Vanna Scotland, MD  [facility] [address], [address] [place], [place] [address] [phone]  I, Robbi Garter , am acting as a scribe for Vanna Scotland, MD  I have reviewed the above documentation for accuracy and completeness, and I agree with the above.   Vanna Scotland, MD  I spent 25 min with this patient of which greater than 50% was spent in counseling and coordination of care with the patient.

## 2018-09-06 ENCOUNTER — Encounter (INDEPENDENT_AMBULATORY_CARE_PROVIDER_SITE_OTHER): Payer: Self-pay

## 2018-12-02 IMAGING — XA IR CONVERT NEPHROMSTOMY TO NEPHROURETERAL CATH RIGHT
7 series · 8 of 8 positions shown · non-contrast
Comparison: 02/11/2018

CLINICAL DATA: Chronically obstructed right kidney secondary to an
impacted distal ureteral calculus. Status post prior right
percutaneous nephrostomy tube placement on 02/11/2018 after failure
to place a retrograde stent cystoscopically due to the impacted
calculus. Request has now been made to convert drainage to a
ureteral stent and secure access for planned antegrade ureteroscopy
and stone extraction in a week and a half.

EXAM:
RIGHT URETERAL STENT PLACEMENT THROUGH PRE-EXISTING NEPHROSTOMY
ACCESS

[Series 1: fl - angio · 1 of 1 slices shown (1 of 7)]
[im 1/1]
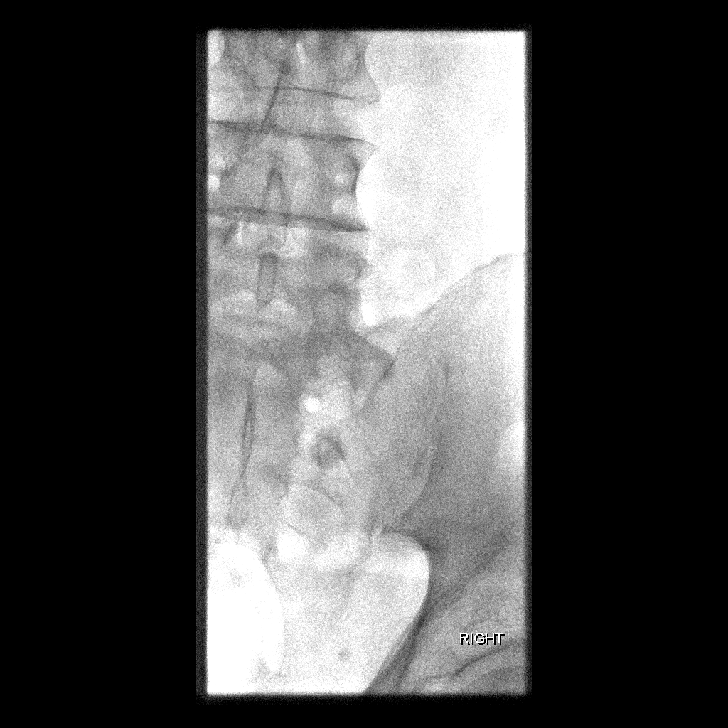

[Series 2: fl - angio · 1 of 1 slices shown (2 of 7)]
[im 1/1]
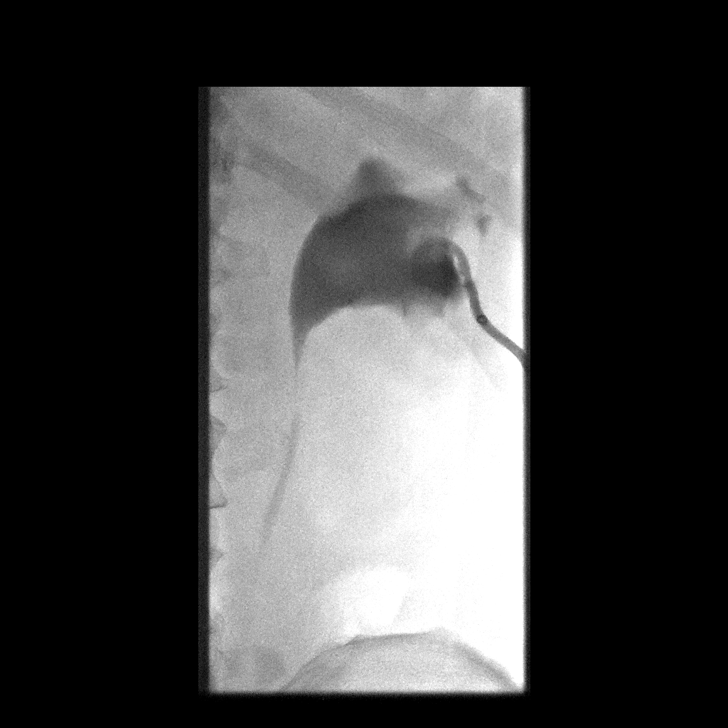

[Series 3: fl - angio · 1 of 1 slices shown (3 of 7)]
[im 1/1]
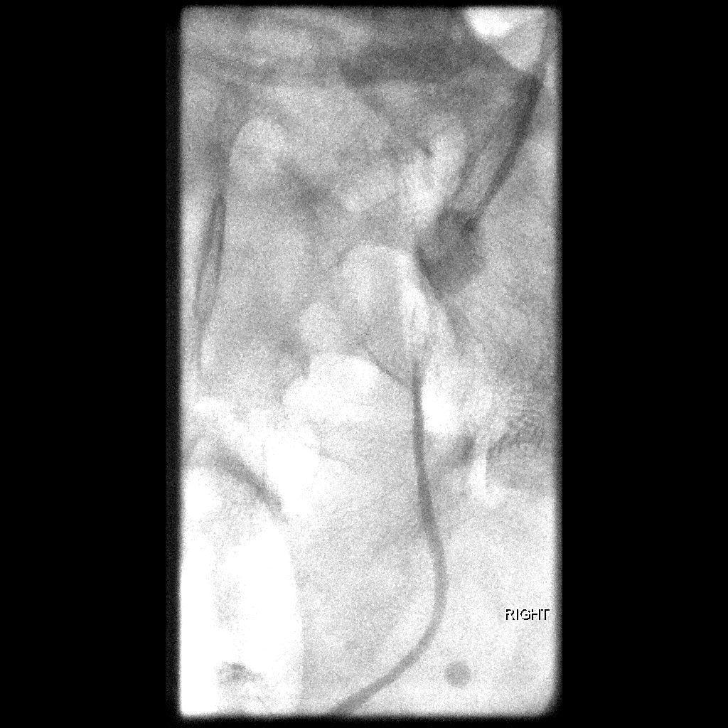

[Series 4: fl - angio · 1 of 1 slices shown (4 of 7)]
[im 1/1]
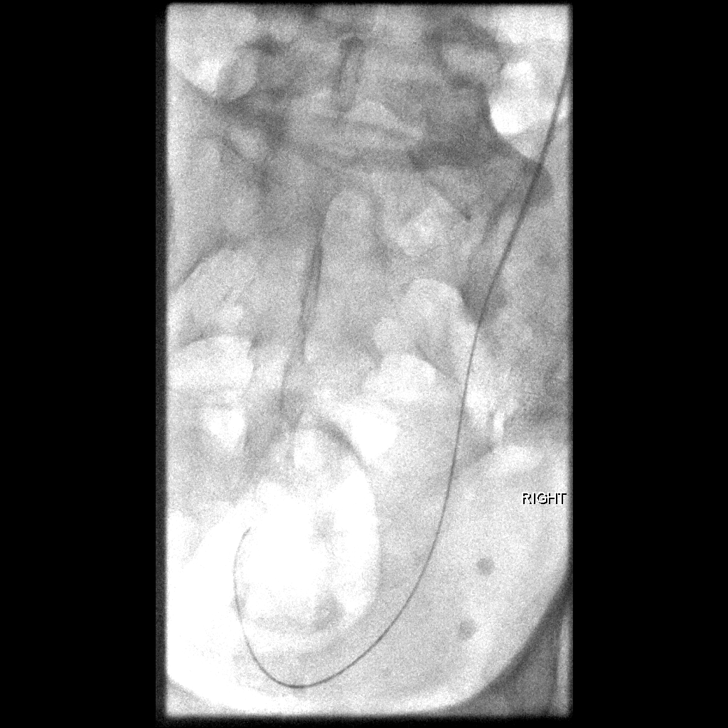

[Series 5: fl - angio · 1 of 1 slices shown (5 of 7)]
[im 1/1]
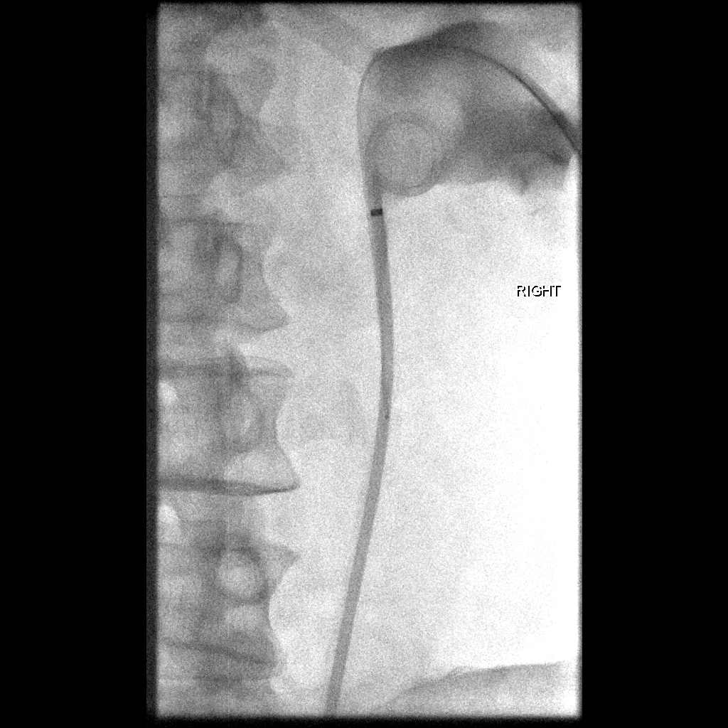

[Series 6: fl - angio · 1 of 1 slices shown (6 of 7)]
[im 1/1]
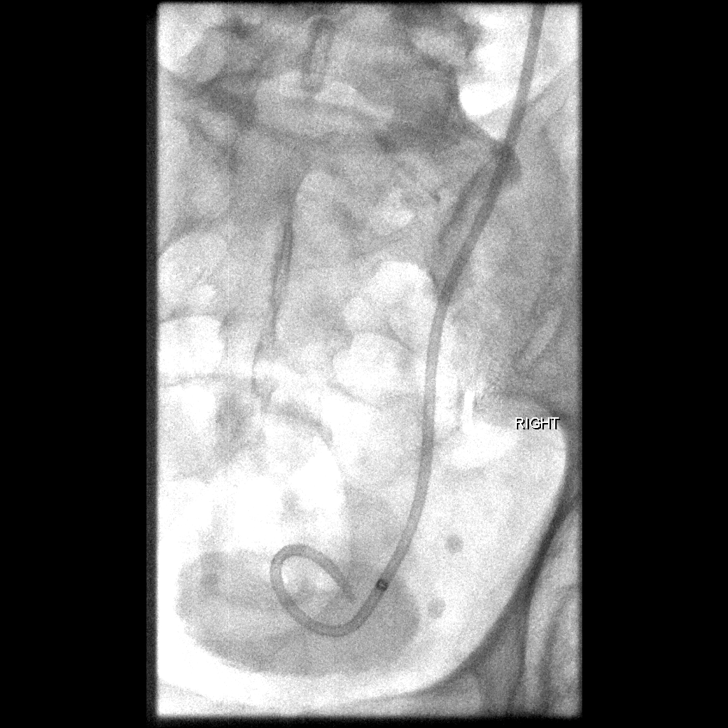

[Series 7: fl - angio · 2 of 2 slices shown (7 of 7)]
[im 1/2]
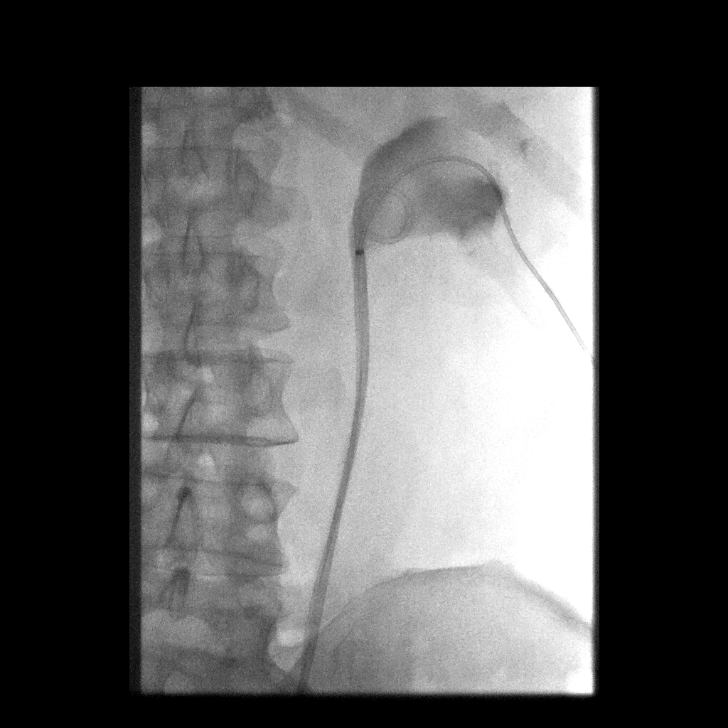
[im 2/2]
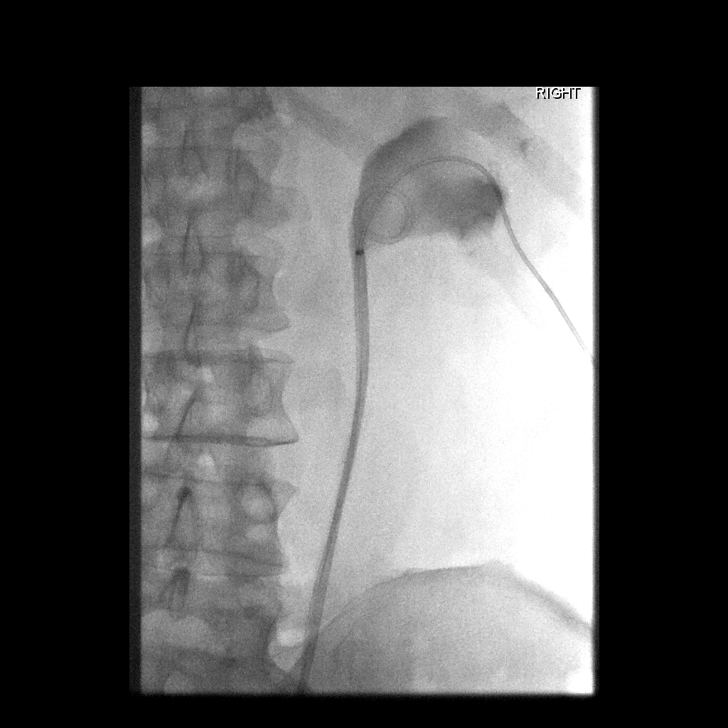

[8 of 8 positions shown; findings below may reference images not displayed]

ANESTHESIA/SEDATION:
Sedation:  2.0 mg IV Versed; 100 mcg IV Fentanyl.

Total Moderate Sedation Time:  29 minutes.

The patient's level of consciousness and physiologic status were
continuously monitored during the procedure by Radiology nursing.

Additional Medications: 2 g IV Ancef. IV antibiotic was administered
in an appropriate time frame prior to the procedure.

FLUOROSCOPY TIME:  7 minutes and 18 seconds.  116 mGy.

CONTRAST:  30 mL [id number]

PROCEDURE:
The procedure, risks, benefits, and alternatives were explained to
the patient. Questions regarding the procedure were encouraged and
answered. The patient understands and consents to the procedure. A
time-out was performed prior to initiating the procedure.

The nephrostomy tube and surrounding skin was prepped with
chlorhexidine in a sterile fashion, and a sterile drape was applied
covering the operative field. A sterile gown and sterile gloves were
used for the procedure. Local anesthesia was provided with 1%
Lidocaine.

The preexisting nephrostomy tube was injected with contrast
material. Fluoroscopic spot images were obtained of the collecting
system and ureter. Additional injection of the ureter was performed
after removal of the nephrostomy tube over a guidewire and insertion
of a 5-French catheter into the ureter. The 5 French catheter was
further advanced in the ureter and past a distal ureteral calculus
over a hydrophilic guidewire.

A guidewire was advanced into the bladder. The wire was kinked to
estimate appropriate length of ureteral stent. An 8-French by 26 cm
ureteral stent was then advanced over an additional guidewire. The
distal portion was formed at the level of the bladder. Proximal
portion was then formed at the level of the renal collecting system.
The stent pusher was then injected with contrast material to
evaluate drainage under fluoroscopy.

The stent pressure was removed over a guidewire. A 5 French catheter
was then advanced over the guidewire into the ureter in advanced to
the level of the mid ureter. The catheter was capped. It was secured
at the skin with a retention suture and overlying dressing.

COMPLICATIONS:
None.
FINDINGS: An impacted calculus remains in the distal right ureter at roughly
the S2 level of the sacrum. Initially, a guidewire was able to pass
around the calculus and into the distal ureter and bladder. However,
there was difficulty advancing a catheter past the stone.
Eventually, a hydrophilic catheter was able to pass by the stone and
into the bladder allowing eventual placement of the ureteral stent.

The 26 cm ureteral stent extends from the renal pelvis into the
bladder lumen. Injection of contrast material shows excellent
drainage via the ureteral stent into the bladder lumen. Antegrade
access was secured with a 5 French catheter extending into the
ureter for the planned operative procedure. This catheter was capped
and will be secured under a Tegaderm dressing until planned surgery
on 03/28/2018
IMPRESSION: 1. Successful placement of antegrade 8 French x 26 cm ureteral stent
extending from the renal pelvis into the bladder. There is good
antegrade drainage via the stent of injected contrast material from
the level of the renal collecting system into the bladder.
2. A 5 French safety catheter was left in place to secure access for
the upcoming operative procedure. This extends into the ureter and
terminates just above the iliac crest.

## 2019-01-20 ENCOUNTER — Ambulatory Visit
Admission: RE | Admit: 2019-01-20 | Discharge: 2019-01-20 | Disposition: A | Discharge status: Home / Self Care | Payer: Medicare Other | Source: Ambulatory Visit | Attending: Urology | Admitting: Urology

## 2019-01-20 ENCOUNTER — Other Ambulatory Visit: Payer: Self-pay

## 2019-01-20 DIAGNOSIS — N201 Calculus of ureter: Secondary | ICD-10-CM

## 2019-01-25 ENCOUNTER — Other Ambulatory Visit: Payer: Self-pay

## 2019-01-25 ENCOUNTER — Ambulatory Visit: Payer: Medicare Other | Admitting: Urology

## 2019-01-25 ENCOUNTER — Encounter: Payer: Self-pay | Admitting: Urology

## 2019-01-25 VITALS — BP 167/95 | HR 63 | Ht 72.0 in | Wt 231.0 lb

## 2019-01-25 DIAGNOSIS — N1330 Unspecified hydronephrosis: Secondary | ICD-10-CM

## 2019-01-25 DIAGNOSIS — N261 Atrophy of kidney (terminal): Secondary | ICD-10-CM | POA: Diagnosis not present

## 2019-01-25 DIAGNOSIS — Z87442 Personal history of urinary calculi: Secondary | ICD-10-CM | POA: Diagnosis not present

## 2019-01-25 NOTE — Progress Notes (Signed)
01/25/2019 8:38 AM   Robert Fields 1951/07/03 [id number]  Referring provider: Adin Hector, MD [facility] [place],  [place] [address]  Chief Complaint  Patient presents with  . Ureteral Stone    Follow up    HPI: 68 year old male with a personal history of kidney stones returns today for routine six-month follow-up with renal ultrasound.  He initially presented with an obstructing right ureteral calculus with an atrophic right kidney, chronic hydronephrosis.  He ultimately elected to pursue intervention for the stone despite having fairly low renal function on the side, from 26% split function.  Surgery was ultimately successful.  He follows up today 6 months later.  He denies any flank pain dysuria or any other symptoms.  He is at no stone episodes.  He is really pushing water this year.  Renal ultrasound today shows stable mild right hydronephrosis and atrophy.  Nonobstructing left-sided stone is also seen again today.   PMH: Past Medical History:  Diagnosis Date  . CAD (coronary artery disease)   . History of kidney stones   . Hyperlipemia   . Hypertension   . MI (myocardial infarction) [facility])     Surgical History: Past Surgical History:  Procedure Laterality Date  . CARDIAC CATHETERIZATION    . CORONARY ARTERY BYPASS GRAFT  2004   5 vessel  . CYSTOSCOPY/RETROGRADE/URETEROSCOPY Left 02/08/2018   Procedure: CYSTOSCOPY/RETROGRADE/URETEROSCOPY;  Surgeon: Hollice Espy, MD;  Location: [facility] ORS;  Service: Urology;  Laterality: Left;  . CYSTOSCOPY/URETEROSCOPY/HOLMIUM LASER/STENT PLACEMENT Right 03/29/2018   Procedure: CYSTOSCOPY/URETEROSCOPY/HOLMIUM LASER/STENT PLACEMENT;  Surgeon: Hollice Espy, MD;  Location: [facility] ORS;  Service: Urology;  Laterality: Right;  antegrade vs retrograde  . CYSTOSCOPY/URETEROSCOPY/HOLMIUM LASER/STENT PLACEMENT Right 04/12/2018   Procedure: CYSTOSCOPY/URETEROSCOPY/HOLMIUM LASER/STENT EXCHANGE;  Surgeon:  Hollice Espy, MD;  Location: [facility] ORS;  Service: Urology;  Laterality: Right;  antegrade vs retrograde  . IR CONVERT RIGHT NEPHROSTOMY TO NEPHROURETERAL CATH  03/17/2018  . IR NEPHROSTOMY PLACEMENT RIGHT  02/11/2018    Home Medications:  Allergies as of 01/25/2019      Reactions   Doxycycline Hyclate Rash      Medication List       Accurate as of January 25, 2019  8:38 AM. If you have any questions, ask your nurse or doctor.        aspirin EC 81 MG tablet Take 81 mg by mouth at bedtime.   ezetimibe 10 MG tablet Commonly known as: ZETIA Take 10 mg by mouth at bedtime.   losartan 50 MG tablet Commonly known as: COZAAR Take 50 mg by mouth at bedtime.   Multi-Vitamins Tabs Take 1 tablet by mouth at bedtime.   rosuvastatin 40 MG tablet Commonly known as: CRESTOR Take 40 mg by mouth at bedtime.       Allergies:  Allergies  Allergen Reactions  . Doxycycline Hyclate Rash    Family History: Family History  Problem Relation Age of Onset  . Heart failure Mother   . Lung cancer Mother     Social History:  reports that he has never smoked. He has never used smokeless tobacco. He reports current alcohol use of about 2.0 standard drinks of alcohol per week. He reports that he does not use drugs.  ROS: UROLOGY Frequent Urination?: No Hard to postpone urination?: No Burning/pain with urination?: No Get up at night to urinate?: No Leakage of urine?: No Urine stream starts and stops?: No Trouble starting stream?: No Do you have to strain  to urinate?: No Blood in urine?: No Urinary tract infection?: No Sexually transmitted disease?: No Injury to kidneys or bladder?: No Painful intercourse?: No Weak stream?: No Erection problems?: No Penile pain?: No  Gastrointestinal Nausea?: No Vomiting?: No Indigestion/heartburn?: No Diarrhea?: No Constipation?: No  Constitutional Fever: No Night sweats?: No Weight loss?: No Fatigue?: No  Skin Skin rash/lesions?: No  Itching?: No  Eyes Blurred vision?: No Double vision?: No  Ears/Nose/Throat Sore throat?: No Sinus problems?: No  Hematologic/Lymphatic Swollen glands?: No Easy bruising?: No  Cardiovascular Leg swelling?: No Chest pain?: No  Respiratory Cough?: No Shortness of breath?: No  Endocrine Excessive thirst?: No  Musculoskeletal Back pain?: No Joint pain?: No  Neurological Headaches?: No Dizziness?: No  Psychologic Depression?: No Anxiety?: No  Physical Exam: BP (!) 167/95   Pulse 63   Ht 6' (1.829 m)   Wt 231 lb (104.8 kg)   BMI 31.33 kg/m   Constitutional:  Alert and oriented, No acute distress. HEENT: [place] AT, moist mucus membranes.  Trachea midline, no masses. Cardiovascular: No clubbing, cyanosis, or edema. Respiratory: Normal respiratory effort, no increased work of breathing. Skin: No rashes, bruises or suspicious lesions. Neurologic: Grossly intact, no focal deficits, moving all 4 extremities. Psychiatric: Normal mood and affect.  Laboratory Data: Lab Results  Component Value Date   WBC 9.8 03/31/2018   HGB 13.8 03/31/2018   HCT 40.1 03/31/2018   MCV 89.6 03/31/2018   PLT 273 03/31/2018    Lab Results  Component Value Date   CREATININE 1.39 (H) 03/31/2018   Pertinent Imaging: CLINICAL DATA:  Whole right ureteral stone  EXAM: RENAL / URINARY TRACT ULTRASOUND COMPLETE  COMPARISON:  08/19/2018 ultrasound  FINDINGS: Right Kidney:  Renal measurements: 11.0 x 6.2 x 5.9 cm = volume: 209 mL. Mild right hydronephrosis. Normal echotexture. Mild cortical thinning. No mass.  Left Kidney:  Renal measurements: 13.2 x 6.6 x 6.6 cm = volume: 284 mL. Small exophytic cyst off the midpole measures 9 mm. Shadowing echogenic stone in the lower pole measures 10 mm, nonobstructing. No hydronephrosis. Normal echotexture.  Bladder:  Appears normal for degree of bladder distention.  IMPRESSION: Mild right hydronephrosis, similar to prior  study.  10 mm left lower pole nephrolithiasis. No hydronephrosis on the left.   Electronically Signed   By: Robert Fields  [place] M.D.   On: 01/20/2019 19:06  RUS personally reviewed today.  Agree with radiologic interpretation.  Assessment & Plan:    1. Right renal atrophy Stable right renal atrophy secondary to chronic obstruction which is now relieved We will continue to monitor with serial ultrasound, follow-up renal ultrasound in a year Solitary kidney precautions  2. Hydronephrosis of right kidney Chronic, stable, mild See above - US RENAL; Future  3. History of kidney stones Personal history of kidney stones Left nonobstructing stones appear to be stable and is he is currently asymptomatic Prefers observation, will reimage in 1 year with renal ultrasound He is agreeable this plan will return sooner as needed Discussed stone prevention again today   Return in about 1 year (around 01/25/2020) for RUS.  Vanna ScotlandAshley Rhylan Gross, MD  [facility] [address], [address] [place], [place] [phone]

## 2019-09-19 ENCOUNTER — Other Ambulatory Visit: Payer: Self-pay | Admitting: Student

## 2019-09-19 DIAGNOSIS — R29898 Other symptoms and signs involving the musculoskeletal system: Secondary | ICD-10-CM

## 2019-09-19 DIAGNOSIS — R202 Paresthesia of skin: Secondary | ICD-10-CM

## 2019-09-19 DIAGNOSIS — R200 Anesthesia of skin: Secondary | ICD-10-CM

## 2019-09-28 ENCOUNTER — Other Ambulatory Visit: Payer: Self-pay

## 2019-09-28 ENCOUNTER — Ambulatory Visit
Admission: RE | Admit: 2019-09-28 | Discharge: 2019-09-28 | Disposition: A | Discharge status: Home / Self Care | Payer: Medicare PPO | Source: Ambulatory Visit | Attending: Student | Admitting: Student

## 2019-09-28 DIAGNOSIS — R200 Anesthesia of skin: Secondary | ICD-10-CM | POA: Diagnosis present

## 2019-09-28 DIAGNOSIS — R202 Paresthesia of skin: Secondary | ICD-10-CM | POA: Insufficient documentation

## 2019-09-28 DIAGNOSIS — R29898 Other symptoms and signs involving the musculoskeletal system: Secondary | ICD-10-CM | POA: Diagnosis present

## 2019-10-06 ENCOUNTER — Other Ambulatory Visit: Payer: Self-pay | Admitting: Neurosurgery

## 2019-10-07 NOTE — Addendum Note (Signed)
Addended by: Andreia Gandolfi on: 10/07/2019 11:00 AM   Modules accepted: Orders  

## 2019-10-10 ENCOUNTER — Other Ambulatory Visit: Payer: Self-pay

## 2019-10-10 ENCOUNTER — Encounter
Admission: RE | Admit: 2019-10-10 | Discharge: 2019-10-10 | Disposition: A | Discharge status: Home / Self Care | Payer: Medicare PPO | Source: Ambulatory Visit | Attending: Neurosurgery | Admitting: Neurosurgery

## 2019-10-10 ENCOUNTER — Other Ambulatory Visit
Admission: RE | Admit: 2019-10-10 | Discharge: 2019-10-10 | Disposition: A | Discharge status: Home / Self Care | Payer: Medicare PPO | Source: Ambulatory Visit | Attending: Neurosurgery | Admitting: Neurosurgery

## 2019-10-10 DIAGNOSIS — Z01812 Encounter for preprocedural laboratory examination: Secondary | ICD-10-CM | POA: Insufficient documentation

## 2019-10-10 DIAGNOSIS — Z20822 Contact with and (suspected) exposure to covid-19: Secondary | ICD-10-CM | POA: Diagnosis not present

## 2019-10-10 LAB — URINALYSIS, ROUTINE W REFLEX MICROSCOPIC
Bilirubin Urine: NEGATIVE
Glucose, UA: NEGATIVE mg/dL
Hgb urine dipstick: NEGATIVE
Ketones, ur: NEGATIVE mg/dL
Nitrite: POSITIVE — AB
Protein, ur: 30 mg/dL — AB
Specific Gravity, Urine: 1.021 (ref 1.005–1.030)
WBC, UA: >50 WBC/hpf — ABNORMAL HIGH (ref 0–5)
pH: 5 (ref 5.0–8.0)

## 2019-10-10 LAB — BASIC METABOLIC PANEL
Anion gap: 10 (ref 5–15)
BUN: 18 mg/dL (ref 8–23)
CO2: 25 mmol/L (ref 22–32)
Calcium: 9.5 mg/dL (ref 8.9–10.3)
Chloride: 102 mmol/L (ref 98–111)
Creatinine, Ser: 0.93 mg/dL (ref 0.61–1.24)
GFR calc Af Amer: >60 mL/min (ref >60)
GFR calc non Af Amer: >60 mL/min (ref >60)
Glucose, Bld: 93 mg/dL (ref 70–99)
Potassium: 4.4 mmol/L (ref 3.5–5.1)
Sodium: 137 mmol/L (ref 135–145)

## 2019-10-10 LAB — APTT: aPTT: 30 seconds (ref 24–36)

## 2019-10-10 LAB — SURGICAL PCR SCREEN
MRSA, PCR: NEGATIVE
Staphylococcus aureus: NEGATIVE

## 2019-10-10 LAB — CBC
HCT: 42.2 % (ref 39.0–52.0)
Hemoglobin: 14.3 g/dL (ref 13.0–17.0)
MCH: 29.5 pg (ref 26.0–34.0)
MCHC: 33.9 g/dL (ref 30.0–36.0)
MCV: 87.2 fL (ref 80.0–100.0)
Platelets: 297 10*3/uL (ref 150–400)
RBC: 4.84 MIL/uL (ref 4.22–5.81)
RDW: 13.4 % (ref 11.5–15.5)
WBC: 6.3 10*3/uL (ref 4.0–10.5)
nRBC: 0 % (ref 0.0–0.2)

## 2019-10-10 LAB — PROTIME-INR
INR: 1 (ref 0.8–1.2)
Prothrombin Time: 13.3 seconds (ref 11.4–15.2)

## 2019-10-10 LAB — TYPE AND SCREEN
ABO/RH(D): A NEG
Antibody Screen: NEGATIVE

## 2019-10-10 NOTE — Patient Instructions (Signed)
Your pre-procedure COVID test is scheduled 10/10/2019 @ 11:00am.  Check in at the [facility] main entrance first for labs.  Your surgery is scheduled on: Wednesday 10/12/2019 Report to Same Day Surgery [facility] elevator to 2nd floor. Check in with surgery information desk.) To find out your arrival time, call [phone] 1:00-3:00 PM on Tuesday 10/11/2019  Remember: Instructions that are not followed completely may result in serious medical risk, up to and including death, or upon the discretion of your surgeon and anesthesiologist your surgery may need to be rescheduled.   __x__ 1. Do not eat food (including mints, candies, chewing gum) after midnight the night before your procedure. You may drink clear liquids up to 2 hours before you are scheduled to arrive at the hospital for your procedure.  Do not drink anything within 2 hours of your scheduled arrival to the hospital.  Approved clear liquids:  --Water or Apple juice without pulp  --Clear carbohydrate beverage such as Gatorade or Powerade  --Black Coffee or Clear Tea (No milk, no creamers, do not add anything to the coffee or tea)   __x__ 2. No Alcohol or smoking for 24 hours before or after surgery.  __x__ 3. Notify your doctor if there is any change in your medical condition (cold, fever, infections).  __x__ 4. On the morning of surgery brush your teeth with toothpaste and water.  You may rinse your mouth with mouthwash if you wish.  Do not swallow any toothpaste or mouthwash.  Please read over the following fact sheets that you were given: [facility] Preparing for Surgery   __x__ Use CHG Soap as directed on instruction sheet.  Do not wear jewelry, lotions, powders, deodorant or perfumes on the day of surgery. Do not shave below your neckline 48 hours prior to surgery.  Do not bring valuables to the hospital.  [facility] is not responsible for any belongings or valuables.   Eyeglasses may not be worn into  surgery. For patients admitted to the hospital, discharge time is determined by your treatment team. For patients discharged on the day of surgery, you will NOT be permitted to drive yourself home.  You must have a responsible adult with you for 24 hours after surgery.  __x__ Take these medicines on the morning of surgery with a SMALL SIP OF WATER:  1. Tylenol if needed  __x__ Follow recommendations from Cardiologist, Pulmonologist or PCP regarding stopping Aspirin, Coumadin, Plavix, Eliquis, Effient, Pradaxa, and Pletal.  __x__ STARTING TODAY: Do not take any Anti-inflammatories such as Advil, Ibuprofen, Motrin, Aleve, Naproxen, Naprosyn, BC/Goodies powders or aspirin products. You may take Tylenol if needed.   __x__ STARTING TODAY: Do not take any over the counter supplements/vitamins until after surgery.  RN reviewed instructions with patient via telephone interview, patient verbalized understanding.  Patient will receive printed copy at preop lab visit.  __________________________ Phone call 10/10/2019 @ 9:20am

## 2019-10-11 ENCOUNTER — Other Ambulatory Visit: Payer: Medicare PPO

## 2019-10-11 LAB — SARS CORONAVIRUS 2 (TAT 6-24 HRS): SARS Coronavirus 2: NEGATIVE

## 2019-10-12 ENCOUNTER — Ambulatory Visit: Payer: Medicare PPO

## 2019-10-12 ENCOUNTER — Ambulatory Visit
Admission: RE | Admit: 2019-10-12 | Discharge: 2019-10-12 | Disposition: A | Discharge status: Home / Self Care | Payer: Medicare PPO | Attending: Neurosurgery | Admitting: Neurosurgery

## 2019-10-12 ENCOUNTER — Encounter: Payer: Self-pay | Admitting: Neurosurgery

## 2019-10-12 ENCOUNTER — Encounter
Admission: RE | Disposition: A | Discharge status: Home / Self Care | Payer: Self-pay | Source: Home / Self Care | Attending: Neurosurgery

## 2019-10-12 ENCOUNTER — Other Ambulatory Visit: Payer: Self-pay

## 2019-10-12 DIAGNOSIS — I2510 Atherosclerotic heart disease of native coronary artery without angina pectoris: Secondary | ICD-10-CM | POA: Insufficient documentation

## 2019-10-12 DIAGNOSIS — Z7982 Long term (current) use of aspirin: Secondary | ICD-10-CM | POA: Insufficient documentation

## 2019-10-12 DIAGNOSIS — G959 Disease of spinal cord, unspecified: Secondary | ICD-10-CM | POA: Insufficient documentation

## 2019-10-12 DIAGNOSIS — I6529 Occlusion and stenosis of unspecified carotid artery: Secondary | ICD-10-CM | POA: Diagnosis not present

## 2019-10-12 DIAGNOSIS — Z951 Presence of aortocoronary bypass graft: Secondary | ICD-10-CM | POA: Diagnosis not present

## 2019-10-12 DIAGNOSIS — Z881 Allergy status to other antibiotic agents status: Secondary | ICD-10-CM | POA: Diagnosis not present

## 2019-10-12 DIAGNOSIS — E785 Hyperlipidemia, unspecified: Secondary | ICD-10-CM | POA: Diagnosis not present

## 2019-10-12 DIAGNOSIS — Z981 Arthrodesis status: Secondary | ICD-10-CM

## 2019-10-12 DIAGNOSIS — I10 Essential (primary) hypertension: Secondary | ICD-10-CM | POA: Diagnosis not present

## 2019-10-12 DIAGNOSIS — M4802 Spinal stenosis, cervical region: Secondary | ICD-10-CM | POA: Insufficient documentation

## 2019-10-12 DIAGNOSIS — Z87891 Personal history of nicotine dependence: Secondary | ICD-10-CM | POA: Insufficient documentation

## 2019-10-12 DIAGNOSIS — Z79899 Other long term (current) drug therapy: Secondary | ICD-10-CM | POA: Diagnosis not present

## 2019-10-12 DIAGNOSIS — Z419 Encounter for procedure for purposes other than remedying health state, unspecified: Secondary | ICD-10-CM

## 2019-10-12 DIAGNOSIS — E041 Nontoxic single thyroid nodule: Secondary | ICD-10-CM | POA: Diagnosis not present

## 2019-10-12 HISTORY — PX: ANTERIOR CERVICAL DECOMP/DISCECTOMY FUSION: SHX1161

## 2019-10-12 LAB — ABO/RH: ABO/RH(D): A NEG

## 2019-10-12 SURGERY — ANTERIOR CERVICAL DECOMPRESSION/DISCECTOMY FUSION 1 LEVEL
Anesthesia: General

## 2019-10-12 MED ORDER — LIDOCAINE HCL (CARDIAC) PF 100 MG/5ML IV SOSY
PREFILLED_SYRINGE | INTRAVENOUS | Status: DC | PRN
  Administered 2019-10-12: 100 mg via INTRAVENOUS

## 2019-10-12 MED ORDER — OXYCODONE HCL 5 MG PO TABS: 5.0000 mg | ORAL_TABLET | ORAL | 0 refills | Status: DC | PRN

## 2019-10-12 MED ORDER — PHENYLEPHRINE HCL (PRESSORS) 10 MG/ML IV SOLN
INTRAVENOUS | Status: AC
  Filled 2019-10-12: qty 1

## 2019-10-12 MED ORDER — CEFAZOLIN SODIUM-DEXTROSE 2-4 GM/100ML-% IV SOLN
2.0000 g | Freq: Once | INTRAVENOUS | Status: AC
  Administered 2019-10-12: 2 g via INTRAVENOUS

## 2019-10-12 MED ORDER — THROMBIN 5000 UNITS EX SOLR
CUTANEOUS | Status: DC | PRN
  Administered 2019-10-12: 5000 [IU] via TOPICAL

## 2019-10-12 MED ORDER — SODIUM CHLORIDE 0.9 % IR SOLN
Status: DC | PRN
  Administered 2019-10-12: 07:00:00 1000 mL

## 2019-10-12 MED ORDER — EPHEDRINE 5 MG/ML INJ
INTRAVENOUS | Status: AC
  Filled 2019-10-12: qty 10

## 2019-10-12 MED ORDER — GLYCOPYRROLATE 0.2 MG/ML IJ SOLN
INTRAMUSCULAR | Status: AC
  Filled 2019-10-12: qty 1

## 2019-10-12 MED ORDER — PHENYLEPHRINE HCL-NACL 10-0.9 MG/250ML-% IV SOLN
INTRAVENOUS | Status: DC | PRN
  Administered 2019-10-12: 25 ug/min via INTRAVENOUS

## 2019-10-12 MED ORDER — PROPOFOL 500 MG/50ML IV EMUL
INTRAVENOUS | Status: AC
  Filled 2019-10-12: qty 50

## 2019-10-12 MED ORDER — FENTANYL CITRATE (PF) 100 MCG/2ML IJ SOLN
INTRAMUSCULAR | Status: DC | PRN
  Administered 2019-10-12: 100 ug via INTRAVENOUS

## 2019-10-12 MED ORDER — CEFAZOLIN SODIUM-DEXTROSE 2-4 GM/100ML-% IV SOLN
INTRAVENOUS | Status: AC
  Filled 2019-10-12: qty 100

## 2019-10-12 MED ORDER — DEXAMETHASONE SODIUM PHOSPHATE 10 MG/ML IJ SOLN
INTRAMUSCULAR | Status: AC
  Filled 2019-10-12: qty 1

## 2019-10-12 MED ORDER — FENTANYL CITRATE (PF) 100 MCG/2ML IJ SOLN
INTRAMUSCULAR | Status: AC
  Administered 2019-10-12: 25 ug via INTRAVENOUS
  Filled 2019-10-12: qty 2

## 2019-10-12 MED ORDER — LACTATED RINGERS IV SOLN: INTRAVENOUS | Status: DC

## 2019-10-12 MED ORDER — SODIUM CHLORIDE (PF) 0.9 % IJ SOLN
INTRAMUSCULAR | Status: AC
  Filled 2019-10-12: qty 20

## 2019-10-12 MED ORDER — PROPOFOL 10 MG/ML IV BOLUS
INTRAVENOUS | Status: AC
  Filled 2019-10-12: qty 40

## 2019-10-12 MED ORDER — FAMOTIDINE 20 MG PO TABS
ORAL_TABLET | ORAL | Status: AC
  Administered 2019-10-12: 20 mg via ORAL
  Filled 2019-10-12: qty 1

## 2019-10-12 MED ORDER — MIDAZOLAM HCL 2 MG/2ML IJ SOLN
INTRAMUSCULAR | Status: DC | PRN
  Administered 2019-10-12: 2 mg via INTRAVENOUS

## 2019-10-12 MED ORDER — SODIUM CHLORIDE FLUSH 0.9 % IV SOLN
INTRAVENOUS | Status: AC
  Filled 2019-10-12: qty 10

## 2019-10-12 MED ORDER — BACITRACIN 50000 UNITS IM SOLR
INTRAMUSCULAR | Status: AC
  Filled 2019-10-12: qty 1

## 2019-10-12 MED ORDER — REMIFENTANIL HCL 1 MG IV SOLR
INTRAVENOUS | Status: AC
  Filled 2019-10-12: qty 1000

## 2019-10-12 MED ORDER — BUPIVACAINE HCL (PF) 0.5 % IJ SOLN
INTRAMUSCULAR | Status: AC
  Filled 2019-10-12: qty 30

## 2019-10-12 MED ORDER — THROMBIN 5000 UNITS EX SOLR
CUTANEOUS | Status: AC
  Filled 2019-10-12: qty 10000

## 2019-10-12 MED ORDER — EPINEPHRINE PF 1 MG/ML IJ SOLN
INTRAMUSCULAR | Status: AC
  Filled 2019-10-12: qty 1

## 2019-10-12 MED ORDER — SUCCINYLCHOLINE CHLORIDE 20 MG/ML IJ SOLN
INTRAMUSCULAR | Status: DC | PRN
  Administered 2019-10-12: 120 mg via INTRAVENOUS

## 2019-10-12 MED ORDER — MIDAZOLAM HCL 2 MG/2ML IJ SOLN
INTRAMUSCULAR | Status: AC
  Filled 2019-10-12: qty 2

## 2019-10-12 MED ORDER — BUPIVACAINE-EPINEPHRINE (PF) 0.5% -1:200000 IJ SOLN
INTRAMUSCULAR | Status: DC | PRN
  Administered 2019-10-12: 4 mL

## 2019-10-12 MED ORDER — PHENYLEPHRINE HCL (PRESSORS) 10 MG/ML IV SOLN
INTRAVENOUS | Status: DC | PRN
  Administered 2019-10-12 (×3): 150 ug via INTRAVENOUS
  Administered 2019-10-12: 100 ug via INTRAVENOUS
  Administered 2019-10-12 (×2): 150 ug via INTRAVENOUS

## 2019-10-12 MED ORDER — FAMOTIDINE 20 MG PO TABS: 20.0000 mg | ORAL_TABLET | Freq: Once | ORAL | Status: AC

## 2019-10-12 MED ORDER — PROPOFOL 10 MG/ML IV BOLUS
INTRAVENOUS | Status: DC | PRN
  Administered 2019-10-12: 150 mg via INTRAVENOUS

## 2019-10-12 MED ORDER — REMIFENTANIL HCL 1 MG IV SOLR
INTRAVENOUS | Status: DC | PRN
  Administered 2019-10-12: .15 ug/kg/min via INTRAVENOUS

## 2019-10-12 MED ORDER — GLYCOPYRROLATE 0.2 MG/ML IJ SOLN
INTRAMUSCULAR | Status: DC | PRN
  Administered 2019-10-12: .2 mg via INTRAVENOUS

## 2019-10-12 MED ORDER — ONDANSETRON HCL 4 MG/2ML IJ SOLN
INTRAMUSCULAR | Status: AC
  Filled 2019-10-12: qty 2

## 2019-10-12 MED ORDER — ONDANSETRON HCL 4 MG/2ML IJ SOLN: 4.0000 mg | Freq: Once | INTRAMUSCULAR | Status: DC | PRN

## 2019-10-12 MED ORDER — SUCCINYLCHOLINE CHLORIDE 200 MG/10ML IV SOSY
PREFILLED_SYRINGE | INTRAVENOUS | Status: AC
  Filled 2019-10-12: qty 10

## 2019-10-12 MED ORDER — FENTANYL CITRATE (PF) 100 MCG/2ML IJ SOLN
25.0000 ug | INTRAMUSCULAR | Status: DC | PRN
  Administered 2019-10-12 (×3): 25 ug via INTRAVENOUS

## 2019-10-12 MED ORDER — PROPOFOL 500 MG/50ML IV EMUL
INTRAVENOUS | Status: DC | PRN
  Administered 2019-10-12: 150 ug/kg/min via INTRAVENOUS

## 2019-10-12 MED ORDER — TIZANIDINE HCL 4 MG PO TABS: 4.0000 mg | ORAL_TABLET | Freq: Four times a day (QID) | ORAL | 0 refills | Status: DC | PRN

## 2019-10-12 MED ORDER — EPHEDRINE SULFATE 50 MG/ML IJ SOLN
INTRAMUSCULAR | Status: DC | PRN
  Administered 2019-10-12: 10 mg via INTRAVENOUS

## 2019-10-12 MED ORDER — DEXAMETHASONE SODIUM PHOSPHATE 10 MG/ML IJ SOLN
INTRAMUSCULAR | Status: DC | PRN
  Administered 2019-10-12: 10 mg via INTRAVENOUS

## 2019-10-12 MED ORDER — TIZANIDINE HCL 4 MG PO TABS
4.0000 mg | ORAL_TABLET | Freq: Once | ORAL | Status: AC
  Administered 2019-10-12: 4 mg via ORAL
  Filled 2019-10-12 (×2): qty 1

## 2019-10-12 MED ORDER — FENTANYL CITRATE (PF) 100 MCG/2ML IJ SOLN
INTRAMUSCULAR | Status: AC
  Filled 2019-10-12: qty 2

## 2019-10-12 MED ORDER — ONDANSETRON HCL 4 MG/2ML IJ SOLN
INTRAMUSCULAR | Status: DC | PRN
  Administered 2019-10-12: 4 mg via INTRAVENOUS

## 2019-10-12 SURGICAL SUPPLY — 63 items
ADH SKN CLS APL DERMABOND .7 (GAUZE/BANDAGES/DRESSINGS) ×1
AGENT HMST MTR 8 SURGIFLO (HEMOSTASIS) ×1
ALLOGRAFT LORDOTIC 8X11X14 (Bone Implant) ×1 IMPLANT
APL PRP STRL LF DISP 70% ISPRP (MISCELLANEOUS) ×2
BASKET BONE COLLECTION (BASKET) IMPLANT
BULB RESERV EVAC DRAIN JP 100C (MISCELLANEOUS) IMPLANT
BUR NEURO DRILL SOFT 3.0X3.8M (BURR) ×2 IMPLANT
CANISTER SUCT 1200ML W/VALVE (MISCELLANEOUS) ×4 IMPLANT
CHLORAPREP W/TINT 26 (MISCELLANEOUS) ×4 IMPLANT
COUNTER NEEDLE 20/40 LG (NEEDLE) ×2 IMPLANT
COVER LIGHT HANDLE STERIS (MISCELLANEOUS) ×4 IMPLANT
COVER WAND RF STERILE (DRAPES) ×2 IMPLANT
CRADLE LAMINECT ARM (MISCELLANEOUS) ×2 IMPLANT
CUP MEDICINE 2OZ PLAST GRAD ST (MISCELLANEOUS) ×2 IMPLANT
DERMABOND ADVANCED (GAUZE/BANDAGES/DRESSINGS) ×1
DERMABOND ADVANCED .7 DNX12 (GAUZE/BANDAGES/DRESSINGS) ×1 IMPLANT
DRAIN CHANNEL JP 10F RND 20C F (MISCELLANEOUS) IMPLANT
DRAPE C-ARM 42X72 X-RAY (DRAPES) ×4 IMPLANT
DRAPE LAPAROTOMY 77X122 PED (DRAPES) ×2 IMPLANT
DRAPE MICROSCOPE SPINE 48X150 (DRAPES) ×2 IMPLANT
DRAPE POUCH INSTRU U-SHP 10X18 (DRAPES) ×2 IMPLANT
DRAPE SURG 17X11 SM STRL (DRAPES) ×8 IMPLANT
ELECT CAUTERY BLADE TIP 2.5 (TIP) ×2
ELECT REM PT RETURN 9FT ADLT (ELECTROSURGICAL) ×2
ELECTRODE CAUTERY BLDE TIP 2.5 (TIP) ×1 IMPLANT
ELECTRODE REM PT RTRN 9FT ADLT (ELECTROSURGICAL) ×1 IMPLANT
FEE INTRAOP MONITOR IMPULS NCS (MISCELLANEOUS) IMPLANT
FRAME EYE SHIELD (PROTECTIVE WEAR) ×4 IMPLANT
GLOVE BIOGEL PI IND STRL 7.0 (GLOVE) ×1 IMPLANT
GLOVE BIOGEL PI INDICATOR 7.0 (GLOVE) ×1
GLOVE SURG SYN 7.0 (GLOVE) ×4 IMPLANT
GLOVE SURG SYN 7.0 PF PI (GLOVE) ×2 IMPLANT
GLOVE SURG SYN 8.5  E (GLOVE) ×6
GLOVE SURG SYN 8.5 E (GLOVE) ×3 IMPLANT
GLOVE SURG SYN 8.5 PF PI (GLOVE) ×3 IMPLANT
GOWN SRG XL LVL 3 NONREINFORCE (GOWNS) ×1 IMPLANT
GOWN STRL NON-REIN TWL XL LVL3 (GOWNS) ×2
GOWN STRL REUS W/TWL MED LVL3 (GOWN DISPOSABLE) ×2 IMPLANT
GRADUATE 1200CC STRL 31836 (MISCELLANEOUS) ×2 IMPLANT
INTRAOP MONITOR FEE IMPULS NCS (MISCELLANEOUS) ×1
INTRAOP MONITOR FEE IMPULSE (MISCELLANEOUS) ×2
KIT TURNOVER KIT A (KITS) ×2 IMPLANT
MARKER SKIN DUAL TIP RULER LAB (MISCELLANEOUS) ×4 IMPLANT
NDL SAFETY ECLIPSE 18X1.5 (NEEDLE) ×1 IMPLANT
NEEDLE HYPO 18GX1.5 SHARP (NEEDLE) ×2
NEEDLE HYPO 22GX1.5 SAFETY (NEEDLE) ×2 IMPLANT
NS IRRIG 1000ML POUR BTL (IV SOLUTION) ×2 IMPLANT
PACK LAMINECTOMY NEURO (CUSTOM PROCEDURE TRAY) ×2 IMPLANT
PIN CASPAR 14 (PIN) ×1 IMPLANT
PIN CASPAR 14MM (PIN) ×2
PLATE ACP 1.6 18 (Plate) ×1 IMPLANT
SCREW ACP 3.5X17 (Screw) ×4 IMPLANT
SPOGE SURGIFLO 8M (HEMOSTASIS) ×2
SPONGE KITTNER 5P (MISCELLANEOUS) ×2 IMPLANT
SPONGE SURGIFLO 8M (HEMOSTASIS) ×1 IMPLANT
STAPLER SKIN PROX 35W (STAPLE) IMPLANT
SUT V-LOC 90 ABS DVC 3-0 CL (SUTURE) ×2 IMPLANT
SUT VIC AB 3-0 SH 8-18 (SUTURE) ×2 IMPLANT
SYR 30ML LL (SYRINGE) ×2 IMPLANT
TAPE CLOTH 3X10 WHT NS LF (GAUZE/BANDAGES/DRESSINGS) ×2 IMPLANT
TOWEL OR 17X26 4PK STRL BLUE (TOWEL DISPOSABLE) ×6 IMPLANT
TRAY FOLEY MTR SLVR 16FR STAT (SET/KITS/TRAYS/PACK) IMPLANT
TUBING CONNECTING 10 (TUBING) ×2 IMPLANT

## 2019-10-12 NOTE — H&P (Signed)
I have reviewed and confirmed my history and physical from 10/06/2019 with no additions or changes. Plan for C3-4 ACDF.  Risks and benefits reviewed.  Heart sounds normal no MRG. Chest Clear to Auscultation Bilaterally.

## 2019-10-12 NOTE — Anesthesia Preprocedure Evaluation (Addendum)
Anesthesia Evaluation  Patient identified by MRN, date of birth, ID band Patient awake    Reviewed: Allergy & Precautions, H&P , NPO status , Patient's Chart, lab work & pertinent test results, reviewed documented beta blocker date and time   Airway Mallampati: III  TM Distance: >3 FB Neck ROM: full   Comment: No pain on extension or UE weakness.JA Dental  (+) Teeth Intact   Pulmonary neg pulmonary ROS,    Pulmonary exam normal        Cardiovascular Exercise Tolerance: Poor hypertension, On Medications + CAD, + Past MI, + CABG and + Peripheral Vascular Disease  Normal cardiovascular exam Rhythm:regular Rate:Normal     Neuro/Psych negative neurological ROS  negative psych ROS   GI/Hepatic negative GI ROS, Neg liver ROS,   Endo/Other  negative endocrine ROS  Renal/GU Renal disease  negative genitourinary   Musculoskeletal   Abdominal   Peds  Hematology negative hematology ROS (+)   Anesthesia Other Findings Past Medical History: No date: CAD (coronary artery disease) No date: History of kidney stones No date: Hyperlipemia No date: Hypertension No date: MI (myocardial infarction) (HCC) Past Surgical History: No date: CARDIAC CATHETERIZATION 2004: CORONARY ARTERY BYPASS GRAFT     Comment:  5 vessel 02/08/2018: CYSTOSCOPY/RETROGRADE/URETEROSCOPY; Left     Comment:  Procedure: CYSTOSCOPY/RETROGRADE/URETEROSCOPY;  Surgeon:              Vanna Scotland, MD;  Location: [facility] ORS;  Service:               Urology;  Laterality: Left; 03/29/2018: CYSTOSCOPY/URETEROSCOPY/HOLMIUM LASER/STENT PLACEMENT; Right     Comment:  Procedure: CYSTOSCOPY/URETEROSCOPY/HOLMIUM LASER/STENT               PLACEMENT;  Surgeon: Vanna Scotland, MD;  Location: [facility]              ORS;  Service: Urology;  Laterality: Right;  antegrade vs              retrograde 04/12/2018: CYSTOSCOPY/URETEROSCOPY/HOLMIUM LASER/STENT PLACEMENT;  Right  Comment:  Procedure: CYSTOSCOPY/URETEROSCOPY/HOLMIUM LASER/STENT               EXCHANGE;  Surgeon: Vanna Scotland, MD;  Location: [facility]               ORS;  Service: Urology;  Laterality: Right;  antegrade vs              retrograde 03/17/2018: IR CONVERT RIGHT NEPHROSTOMY TO NEPHROURETERAL CATH 02/11/2018: IR NEPHROSTOMY PLACEMENT RIGHT BMI    Body Mass Index: 28.25 kg/m     Reproductive/Obstetrics negative OB ROS                            Anesthesia Physical Anesthesia Plan  ASA: III  Anesthesia Plan: General ETT   Post-op Pain Management:    Induction:   PONV Risk Score and Plan: 3  Airway Management Planned:   Additional Equipment:   Intra-op Plan:   Post-operative Plan:   Informed Consent: I have reviewed the patients History and Physical, chart, labs and discussed the procedure including the risks, benefits and alternatives for the proposed anesthesia with the patient or authorized representative who has indicated his/her understanding and acceptance.     Dental Advisory Given  Plan Discussed with: CRNA  Anesthesia Plan Comments:         Anesthesia Quick Evaluation

## 2019-10-12 NOTE — Anesthesia Procedure Notes (Signed)
Procedure Name: Intubation Date/Time: 10/12/2019 7:35 AM Performed by: Henrietta Hoover, CRNA Pre-anesthesia Checklist: Patient identified, Patient being monitored, Timeout performed, Emergency Drugs available and Suction available Patient Re-evaluated:Patient Re-evaluated prior to induction Oxygen Delivery Method: Circle system utilized Preoxygenation: Pre-oxygenation with 100% oxygen Induction Type: IV induction Ventilation: Mask ventilation without difficulty Laryngoscope Size: McGraph and 4 Grade View: Grade I Tube type: Oral Tube size: 7.5 mm Number of attempts: 1 Airway Equipment and Method: Stylet Placement Confirmation: ETT inserted through vocal cords under direct vision,  positive ETCO2 and breath sounds checked- equal and bilateral Secured at: 21 cm Tube secured with: Tape Dental Injury: Teeth and Oropharynx as per pre-operative assessment  Comments: Head and neck maintained in neutral position

## 2019-10-12 NOTE — Discharge Summary (Addendum)
Procedure: C3-4 ACDF Procedure date: 10/12/2019 Diagnosis: cervical myelopathy   History: Robert Fields is s/p C3-4 ACDF for cervical myelopathy  POD0: Tolerated procedure well. Evaluated in post op recovery still disoriented from anesthesia but able to answer questions and obey commands. Denies any new upper or lower extremity pain/numbness/tingling.  Physical Exam: Vitals:   10/12/19 0624 10/12/19 0922  BP: 109/76 (!) 141/94  Pulse: (!) 103 87  Resp: 16 17  Temp: 97.8 F (36.6 C) (!) 97.2 F (36.2 C)  SpO2: 100% 100%   Strength:5/5 throughout upper and lower extremities, except 4/5 bilateral grip Sensation: intact and symmetric throughout upper and lower extremities Skin: Glue intact at incision site.   Data:  Recent Labs  Lab 10/10/19 1103  NA 137  K 4.4  CL 102  CO2 25  BUN 18  CREATININE 0.93  GLUCOSE 93  CALCIUM 9.5   No results for input(s): AST, ALT, ALKPHOS in the last 168 hours.  Invalid input(s): TBILI   Recent Labs  Lab 10/10/19 1103  WBC 6.3  HGB 14.3  HCT 42.2  PLT 297   Recent Labs  Lab 10/10/19 1103  APTT 30  INR 1.0         Other tests/results: Cervical films pending  Assessment/Plan:  Robert Fields is POD0 s/p C3-4 ACDF for cervical myelopathy. Will continue post op pain control with pain medication, tylenol, and muscle relaxer as needed. He is scheduled to follow up in approximately 2 weeks to monitor progress.   Ivar Drape PA-C Department of Neurosurgery

## 2019-10-12 NOTE — Transfer of Care (Signed)
Immediate Anesthesia Transfer of Care Note  Patient: Robert Fields  Procedure(s) Performed: ANTERIOR CERVICAL DECOMPRESSION/DISCECTOMY FUSION 1 LEVEL C3-4 (N/A )  Patient Location: PACU  Anesthesia Type:General  Level of Consciousness: awake  Airway & Oxygen Therapy: Patient Spontanous Breathing and Patient connected to face mask oxygen  Post-op Assessment: Report given to RN, Post -op Vital signs reviewed and stable and Patient moving all extremities X 4  Post vital signs: Reviewed  Last Vitals:  Vitals Value Taken Time  BP 141/94 10/12/19 0923  Temp    Pulse 88 10/12/19 0925  Resp 20 10/12/19 0925  SpO2 99 % 10/12/19 0925  Vitals shown include unvalidated device data.  Last Pain:  Vitals:   10/12/19 0624  TempSrc: Oral  PainSc: 5          Complications: No apparent anesthesia complications

## 2019-10-12 NOTE — Progress Notes (Signed)
Patient ambulated around the unit , steady with no assistance

## 2019-10-12 NOTE — Discharge Instructions (Addendum)
Your surgeon has performed an operation on your cervical spine (neck) to relieve pressure on the spinal cord and/or nerves. This involved making an incision in the front of your neck and removing one or more of the discs that support your spine. Next, a small piece of bone, a titanium plate, and screws were used to fuse two or more of the vertebrae (bones) together.  The following are instructions to help in your recovery once you have been discharged from the hospital. Even if you feel well, it is important that you follow these activity guidelines. If you do not let your neck heal properly from the surgery, you can increase the chance of return of your symptoms and other complications.  * Do not take anti-inflammatory medications for 3 months after surgery (naproxen [Aleve], ibuprofen [Advil, Motrin], etc.). These medications can prevent your bones from healing properly.  Activity    No bending, lifting, or twisting ("BLT"). Avoid lifting objects heavier than 10 pounds (gallon milk jug).  Where possible, avoid household activities that involve lifting, bending, reaching, pushing, or pulling such as laundry, vacuuming, grocery shopping, and childcare. Try to arrange for help from friends and family for these activities while your back heals.  Increase physical activity slowly as tolerated.  Taking short walks is encouraged, but avoid strenuous exercise. Do not jog, run, bicycle, lift weights, or participate in any other exercises unless specifically allowed by your doctor.  Talk to your doctor before resuming sexual activity.  You should not drive until cleared by your doctor.  Until released by your doctor, you should not return to work or school.  You should rest at home and let your body heal.   You may shower three days after your surgery.  After showering, lightly dab your incision dry. Do not take a tub bath or go swimming until approved by your doctor at your follow-up appointment.  If  your doctor ordered a cervical collar (neck brace) for you, you should wear it whenever you are out of bed. You may remove it when lying down or sleeping, but you should wear it at all other times. Not all neck surgeries require a cervical collar.  If you smoke, we strongly recommend that you quit.  Smoking has been proven to interfere with normal bone healing and will dramatically reduce the success rate of your surgery. Please contact [facility] (800-QUIT-NOW) and use the resources at www.[facility].com for assistance in stopping smoking.  Surgical Incision   If you have a dressing on your incision, you may remove it two days after your surgery. Keep your incision area clean and dry.  If you have staples or stitches on your incision, you should have a follow up scheduled for removal. If you do not have staples or stitches, you will have steri-strips (small pieces of surgical tape) or Dermabond glue. The steri-strips/glue should begin to peel away within about a week (it is fine if the steri-strips fall off before then). If the strips are still in place one week after your surgery, you may gently remove them.  Diet           You may return to your usual diet. However, you may experience discomfort when swallowing in the first month after your surgery. This is normal. You may find that softer foods are more comfortable for you to swallow. Be sure to stay hydrated.  When to Contact Us  You may experience pain in your neck and/or pain between your shoulder blades. This   is normal and should improve in the next few weeks with the help of pain medication, muscle relaxers, and rest. Some patients report that a warm compress on the back of the neck or between the shoulder blades helps.  However, should you experience any of the following, contact us immediately: . New numbness or weakness . Pain that is progressively getting worse, and is not relieved by your pain medication, muscle relaxers, rest, and  warm compresses . Bleeding, redness, swelling, pain, or drainage from surgical incision . Chills or flu-like symptoms . Fever greater than 101.0 F (38.3 C) . Inability to eat, drink fluids, or take medications . Problems with bowel or bladder functions . Difficulty breathing or shortness of breath . Warmth, tenderness, or swelling in your calf Contact Information . During office hours (Monday-Friday 9 am to 5 pm), please call your physician at [phone] and ask for Kendelyn Jean . After hours and weekends, please call [phone] and an answering service will put you in touch with either Dr. Cook or Dr. Yarbrough.  . For a life-threatening emergency, call 911  AMBULATORY SURGERY  DISCHARGE INSTRUCTIONS  1) The drugs that you were given will stay in your system until tomorrow so for the next 24 hours you should not: A) Drive an automobile B) Make any legal decisions C) Drink any alcoholic beverage  2) You may resume regular meals tomorrow.  Today it is better to start with liquids and gradually work up to solid foods. You may eat anything you prefer, but it is better to start with liquids, then soup and crackers, and gradually work up to solid foods.  3) Please notify your doctor immediately if you have any unusual bleeding, trouble breathing, redness and pain at the surgery site, drainage, fever, or pain not relieved by medication.  4) Additional Instructions:   Please contact your physician with any problems or Same Day Surgery at [phone], Monday through Friday 6 am to 4 pm, or [place] [facility] [place] Main number at [phone].  

## 2019-10-12 NOTE — Op Note (Signed)
Indications: Mr. Wortmann is a 69 yo male who presented with cervical myelopathy (G95.9) with progressive symptoms.  Due to worsening symptoms and weakness, surgery was recommended.  Findings: severe stenosis C3/4  Preoperative Diagnosis: Cervical myelopathy Postoperative Diagnosis: same   EBL: 20 ml IVF: 600 ml Drains: none Disposition: Extubated and Stable to PACU Complications: none  No foley catheter was placed.   Preoperative Note:   Risks of surgery discussed include: infection, bleeding, stroke, coma, death, paralysis, CSF leak, nerve/spinal cord injury, numbness, tingling, weakness, complex regional pain syndrome, recurrent stenosis and/or disc herniation, vascular injury, development of instability, neck/back pain, need for further surgery, persistent symptoms, development of deformity, and the risks of anesthesia. The patient understood these risks and agreed to proceed.  Procedure:  1) Anterior cervical diskectomy and fusion at C3-4 2) Anterior cervical instrumentation at C3-4 3) Structural allograft consisting of corticocancellous allograft   Procedure: After obtaining informed consent, the patient taken to the operating room, placed in supine position, general anesthesia induced.  The patient had a small shoulder roll placed behind their shoulders.  The patient received preop antibiotics and IV Decadron.  The patient had a neck incision outlined, was prepped and draped in usual sterile fashion. The incision was injected with local anesthetic.   An incision was opened, dissection taken down medial to the carotid artery and jugular vein, lateral to the trachea and esophagus.  The prevertebral fascia identified and a localizing x-ray demonstrated the correct level.  The longus colli were dissected laterally, and self-retaining retractors placed to open the operative field. The microscope was then brought into the field.  With this complete, distractor pins were placed in the  vertebral bodies of C3 and C4. The distractor was placed, and the anulus at C3/4 was opened using a bovie.  Curettes and pituitary rongeurs used to remove the majority of disk, then the drill was used to remove the posterior osteophyte and begin the foraminotomies. The nerve hook was used to elevate the posterior longitudinal ligament, which was then removed with Kerrison rongeurs. The microblunt nerve hook could be passed out the foramen bilaterally.   Meticulous hemostasis obtained.  Structural allograft was tapped behind the anterior lip of the vertebral body at C3/4 (8 mm).    The caspar distractor was removed, and bone wax used for hemostasis. A separate, 18 mm Nuvasive ACP plate was chosen.  Two screws placed in each vertebral body, respectively making sure the screws were behind the locking mechanism.  Final AP and lateral radiographs were taken.   With everything in good position, the wound was irrigated copiously with bacitracin-containing solution and meticulous hemostasis obtained.  Wound was closed in 2 layers using interrupted inverted 3-0 Vicryl sutures.  The wound was dressed with dermabond, the head of bed at 30 degrees, taken to recovery room in stable condition.  No new postop neurological deficits were identified.  Sponge and pattie counts were correct at the end of the procedure.    I performed the entire procedure with Ivar Drape PA as an Designer, television/film set.  Venetia Night MD

## 2019-10-13 NOTE — Anesthesia Postprocedure Evaluation (Signed)
Anesthesia Post Note  Patient: Robert Fields  Procedure(s) Performed: ANTERIOR CERVICAL DECOMPRESSION/DISCECTOMY FUSION 1 LEVEL C3-4 (N/A )  Patient location during evaluation: PACU Anesthesia Type: General Level of consciousness: awake and alert Pain management: pain level controlled Vital Signs Assessment: post-procedure vital signs reviewed and stable Respiratory status: spontaneous breathing, nonlabored ventilation, respiratory function stable and patient connected to nasal cannula oxygen Cardiovascular status: blood pressure returned to baseline and stable Postop Assessment: no apparent nausea or vomiting Anesthetic complications: no     Last Vitals:  Vitals:   10/12/19 1047 10/12/19 1352  BP: (!) 144/98 (!) 138/98  Pulse: 79 81  Resp: 20   Temp: (!) 36.1 C   SpO2: 94% 96%    Last Pain:  Vitals:   10/12/19 1352  TempSrc:   PainSc: 4                  Yevette Edwards

## 2020-01-24 ENCOUNTER — Other Ambulatory Visit: Payer: Self-pay

## 2020-01-24 ENCOUNTER — Ambulatory Visit
Admission: RE | Admit: 2020-01-24 | Discharge: 2020-01-24 | Disposition: A | Discharge status: Home / Self Care | Payer: Medicare PPO | Source: Ambulatory Visit | Attending: Physician Assistant | Admitting: Physician Assistant

## 2020-01-24 DIAGNOSIS — N1330 Unspecified hydronephrosis: Secondary | ICD-10-CM | POA: Diagnosis not present

## 2020-01-24 NOTE — Progress Notes (Signed)
01/25/2020 10:58 AM   [name] [birth year] [id number]  Referring provider: Lynnea Ferrier, MD [address] [facility] [place],  [place] [address] Chief Complaint  Patient presents with  . Follow-up    HPI: Robert Fields is a 69 y.o. male with a personal history of kidney stones returns today for a 1 year follow up for RUS.   He initially presented with an obstructing right ureteral calculus with an atrophic right kidney, chronic hydronephrosis.  He ultimately elected to pursue intervention for the stone despite having fairly low renal function on the side, from 26% split function.  Surgery was ultimately successful.  Renal ultrasound on 01/24/2020 revealed no hydronephrosis or renal obstruction was noted. Nonobstructive left renal calculus was noted.  During the interim, the patient was doing well.  No flank pain or gross hematuria.  No stone episodes.  He is drinking plenty of water. He has decreased his salt intake.  He drinks citrus added to his water.  He continues to follow the recommendations for stone prevention.    Creatinine remains stable/normal.  PMH: Past Medical History:  Diagnosis Date  . CAD (coronary artery disease)   . History of kidney stones   . Hyperlipemia   . Hypertension   . MI (myocardial infarction) [facility])     Surgical History: Past Surgical History:  Procedure Laterality Date  . ANTERIOR CERVICAL DECOMP/DISCECTOMY FUSION N/A 10/12/2019   Procedure: ANTERIOR CERVICAL DECOMPRESSION/DISCECTOMY FUSION 1 LEVEL C3-4;  Surgeon: Venetia Night, MD;  Location: [facility] ORS;  Service: Neurosurgery;  Laterality: N/A;  . CARDIAC CATHETERIZATION    . CORONARY ARTERY BYPASS GRAFT  2004   5 vessel  . CYSTOSCOPY/RETROGRADE/URETEROSCOPY Left 02/08/2018   Procedure: CYSTOSCOPY/RETROGRADE/URETEROSCOPY;  Surgeon: Vanna Scotland, MD;  Location: [facility] ORS;  Service: Urology;  Laterality: Left;  . CYSTOSCOPY/URETEROSCOPY/HOLMIUM LASER/STENT  PLACEMENT Right 03/29/2018   Procedure: CYSTOSCOPY/URETEROSCOPY/HOLMIUM LASER/STENT PLACEMENT;  Surgeon: Vanna Scotland, MD;  Location: [facility] ORS;  Service: Urology;  Laterality: Right;  antegrade vs retrograde  . CYSTOSCOPY/URETEROSCOPY/HOLMIUM LASER/STENT PLACEMENT Right 04/12/2018   Procedure: CYSTOSCOPY/URETEROSCOPY/HOLMIUM LASER/STENT EXCHANGE;  Surgeon: Vanna Scotland, MD;  Location: [facility] ORS;  Service: Urology;  Laterality: Right;  antegrade vs retrograde  . IR CONVERT RIGHT NEPHROSTOMY TO NEPHROURETERAL CATH  03/17/2018  . IR NEPHROSTOMY PLACEMENT RIGHT  02/11/2018    Home Medications:  Allergies as of 01/25/2020      Reactions   Doxycycline Hyclate Rash      Medication List       Accurate as of January 25, 2020 10:58 AM. If you have any questions, ask your nurse or doctor.        celecoxib 100 MG capsule Commonly known as: CELEBREX   ezetimibe 10 MG tablet Commonly known as: ZETIA Take 10 mg by mouth at bedtime.   losartan 25 MG tablet Commonly known as: COZAAR Take 50 mg by mouth at bedtime.   oxyCODONE 5 MG immediate release tablet Commonly known as: Roxicodone Take 1 tablet (5 mg total) by mouth every 4 (four) hours as needed for breakthrough pain.   rosuvastatin 40 MG tablet Commonly known as: CRESTOR Take 40 mg by mouth at bedtime.   tiZANidine 4 MG tablet Commonly known as: Zanaflex Take 1 tablet (4 mg total) by mouth every 6 (six) hours as needed for muscle spasms.       Allergies:  Allergies  Allergen Reactions  . Doxycycline Hyclate Rash    Family History: Family History  Problem Relation Age  of Onset  . Heart failure Mother   . Lung cancer Mother     Social History:  reports that he has never smoked. He has never used smokeless tobacco. He reports current alcohol use of about 2.0 standard drinks of alcohol per week. He reports that he does not use drugs.   Physical Exam: BP 134/83 (BP Location: Left Arm, Patient Position: Sitting, Cuff Size:  Large)   Pulse 66   Ht 6' (1.829 m)   Wt 223 lb 4.8 oz (101.3 kg)   BMI 30.28 kg/m   Constitutional:  Alert and oriented, No acute distress. HEENT: [place] AT, moist mucus membranes.  Trachea midline, no masses. Cardiovascular: No clubbing, cyanosis, or edema. Respiratory: Normal respiratory effort, no increased work of breathing.. Skin: No rashes, bruises or suspicious lesions. Neurologic: Grossly intact, no focal deficits, moving all 4 extremities. Psychiatric: Normal mood and affect.  Laboratory Data:  Lab Results  Component Value Date   CREATININE 0.93 10/10/2019     Pertinent Imaging: CLINICAL DATA:  Right hydronephrosis.  EXAM: RENAL / URINARY TRACT ULTRASOUND COMPLETE  COMPARISON:  January 20, 2019.  FINDINGS: Right Kidney:  Renal measurements: 9.2 x 5.0 x 4.9 cm = volume: 117 mL . Echogenicity within normal limits. No mass or hydronephrosis visualized.  Left Kidney:  Renal measurements: 12.0 x 6.4 x 6.2 cm = volume: 251 mL. 11 mm nonobstructive calculus is noted in lower pole. 1.1 cm simple cyst is seen in midpole. Echogenicity within normal limits. No mass or hydronephrosis visualized.  Bladder:  Appears normal for degree of bladder distention. Probable bilateral ureteral jets are noted.  Other:  None.  IMPRESSION: No hydronephrosis or renal obstruction is noted. Nonobstructive left renal calculus is noted.   Electronically Signed   By: Lupita Raider M.D.   On: 01/24/2020 15:32   I have personally reviewed the images and agree with radiologist interpretation.      Assessment & Plan:    1. Right renal atrophy No evidence of stones on renal ultrasound Stable right renal atrophy without chronic obstruction We will continue to monitor with serial ultrasound, follow-up renal ultrasound in 2 year Solitary kidney precautions (given significant atrophy of right renal unit) Creatinine remains stable at 0.93 as of 09/2019  2.  Hydronephrosis of right kidney  Resolved  3. History of kidney stones Personal history of kidney stones Left nonobstructing stones appear to be stable and is he is currently asymptomatic Prefers observation, will reimage in 2 year with renal ultrasound He is agreeable this plan will return sooner as needed Discussed stone prevention again today   F/u 2 years with RUS or sooner as needed6  [facility] [address], [address] [place], [place] [address] [phone]  I, Theador Hawthorne, am acting as a scribe for Dr. Vanna Scotland.  I have reviewed the above documentation for accuracy and completeness, and I agree with the above.   Vanna Scotland, MD  I spent 30 total minutes on the day of the encounter including pre-visit review of the medical record, face-to-face time with the patient, and post visit ordering of labs/imaging/tests.

## 2020-01-25 ENCOUNTER — Encounter: Payer: Self-pay | Admitting: Urology

## 2020-01-25 ENCOUNTER — Ambulatory Visit: Payer: Medicare PPO | Admitting: Urology

## 2020-01-25 VITALS — BP 134/83 | HR 66 | Ht 72.0 in | Wt 223.3 lb

## 2020-01-25 DIAGNOSIS — N200 Calculus of kidney: Secondary | ICD-10-CM | POA: Diagnosis not present

## 2020-01-25 DIAGNOSIS — N1330 Unspecified hydronephrosis: Secondary | ICD-10-CM | POA: Diagnosis not present

## 2020-01-25 DIAGNOSIS — Z87442 Personal history of urinary calculi: Secondary | ICD-10-CM

## 2020-01-25 DIAGNOSIS — N261 Atrophy of kidney (terminal): Secondary | ICD-10-CM | POA: Diagnosis not present

## 2022-06-10 ENCOUNTER — Encounter: Payer: Self-pay | Admitting: Neurosurgery

## 2022-06-10 ENCOUNTER — Telehealth: Payer: Self-pay

## 2022-06-10 ENCOUNTER — Ambulatory Visit: Payer: Medicare PPO | Admitting: Neurosurgery

## 2022-06-10 VITALS — BP 132/82 | Ht 73.0 in | Wt 233.0 lb

## 2022-06-10 DIAGNOSIS — M21371 Foot drop, right foot: Secondary | ICD-10-CM | POA: Diagnosis not present

## 2022-06-10 NOTE — Telephone Encounter (Signed)
He would like to go to [facility] as his first choice. If they are booked out too far, he will let us know. [facility] will be his 2nd choice.

## 2022-06-10 NOTE — Telephone Encounter (Signed)
Dr.Potter can get him in for 12/20 and [facility] between 12/4-12/14. I called patient and he said to just send it to [facility]. Referral faxed to [facility]. I will check on result after 12/20.

## 2022-06-10 NOTE — Progress Notes (Signed)
Referring Physician:  Lynnea Ferrier, MD [address] [facility] [place],  [place] [address]  Primary Physician:  Lynnea Ferrier, MD  Procedure: C3-4 ACDF Date of procedure: 10/12/2019 Diagnosis: Cervical myelopathy   History of Present Illness: 06/10/2022 [name] is here today with a chief complaint of numbness in his right foot.  He primarily has numbness in his midfoot on the top of his right foot.  This been present for a couple of years.  He also gets some discomfort around the right lateral malleolus.  This has been worsening and bothering him more when he walks.  He has some tingling on the bottom of his foot as well on the right side.  He has no symptoms above his mid calf.  He has no buttock pain.  He has noted some weakness in dorsiflexion of his right ankle over the past couple of years.   Bowel/Bladder Dysfunction: none  Conservative measures:  Physical therapy: none  Multimodal medical therapy including regular antiinflammatories: none  Injections: no epidural steroid injections  Past Surgery: see above  Robert Fields has no symptoms of cervical myelopathy.  The symptoms are causing a significant impact on the patient's life.   Review of Systems:  A 10 point review of systems is negative, except for the pertinent positives and negatives detailed in the HPI.  Past Medical History: Past Medical History:  Diagnosis Date   CAD (coronary artery disease)    History of kidney stones    Hyperlipemia    Hypertension    MI (myocardial infarction) (HCC)     Past Surgical History: Past Surgical History:  Procedure Laterality Date   ANTERIOR CERVICAL DECOMP/DISCECTOMY FUSION N/A 10/12/2019   Procedure: ANTERIOR CERVICAL DECOMPRESSION/DISCECTOMY FUSION 1 LEVEL C3-4;  Surgeon: Venetia Night, MD;  Location: [facility];  Service: Neurosurgery;  Laterality: N/A;   CARDIAC CATHETERIZATION     CORONARY ARTERY BYPASS GRAFT  2004   5  vessel   CYSTOSCOPY/RETROGRADE/URETEROSCOPY Left 02/08/2018   Procedure: CYSTOSCOPY/RETROGRADE/URETEROSCOPY;  Surgeon: Vanna Scotland, MD;  Location: [facility];  Service: Urology;  Laterality: Left;   CYSTOSCOPY/URETEROSCOPY/HOLMIUM LASER/STENT PLACEMENT Right 03/29/2018   Procedure: CYSTOSCOPY/URETEROSCOPY/HOLMIUM LASER/STENT PLACEMENT;  Surgeon: Vanna Scotland, MD;  Location: [facility];  Service: Urology;  Laterality: Right;  antegrade vs retrograde   CYSTOSCOPY/URETEROSCOPY/HOLMIUM LASER/STENT PLACEMENT Right 04/12/2018   Procedure: CYSTOSCOPY/URETEROSCOPY/HOLMIUM LASER/STENT EXCHANGE;  Surgeon: Vanna Scotland, MD;  Location: [facility];  Service: Urology;  Laterality: Right;  antegrade vs retrograde   IR CONVERT RIGHT NEPHROSTOMY TO NEPHROURETERAL CATH  03/17/2018   IR NEPHROSTOMY PLACEMENT RIGHT  02/11/2018    Allergies: Allergies as of 06/10/2022 - Review Complete 06/10/2022  Allergen Reaction Noted   Doxycycline hyclate Rash 12/13/2013    Medications: Current Meds  Medication Sig   ezetimibe (ZETIA) 10 MG tablet Take 10 mg by mouth at bedtime.    losartan (COZAAR) 25 MG tablet Take 50 mg by mouth at bedtime.    rosuvastatin (CRESTOR) 40 MG tablet Take 40 mg by mouth at bedtime.     Social History: Social History   Tobacco Use   Smoking status: Never   Smokeless tobacco: Never  Vaping Use   Vaping Use: Never used  Substance Use Topics   Alcohol use: Yes    Alcohol/week: 2.0 standard drinks of alcohol    Types: 2 Cans of beer per week   Drug use: Never    Family Medical History: Family History  Problem Relation  Age of Onset   Heart failure Mother    Lung cancer Mother     Physical Examination: Vitals:   06/10/22 0915  BP: 132/82    General: Patient is well developed, well nourished, calm, collected, and in no apparent distress. Attention to examination is appropriate.  Neck:   Supple.  Full range of motion.  Respiratory: Patient is breathing without any  difficulty.   NEUROLOGICAL:     Awake, alert, oriented to person, place, and time.  Speech is clear and fluent. Fund of knowledge is appropriate.   Cranial Nerves: Pupils equal round and reactive to light.  Facial tone is symmetric.  Facial sensation is symmetric. Shoulder shrug is symmetric. Tongue protrusion is midline.  There is no pronator drift.  ROM of spine: full.    Strength: Side Biceps Triceps Deltoid Interossei Grip Wrist Ext. Wrist Flex.  R 5 5 5 5 5 5 5   L 5 5 5 5 5 5 5    Side Iliopsoas Quads Hamstring PF DF EHL  R 5 5 5 5  4- 2  L 5 5 5 5 5 5    Reflexes are 1+ and symmetric at the biceps, triceps, brachioradialis, patella and achilles.   Hoffman's is absent.   Bilateral upper and lower extremity sensation is intact to light touch except top of her R foot.    No evidence of dysmetria noted.  Gait is normal.     Medical Decision Making  Imaging: N/a  I have personally reviewed the images and agree with the above interpretation.  Assessment and Plan: [name] is a pleasant 71 y.o. male with symptoms concerning for possible right-sided peroneal palsy versus L5 or combination L5 and S1 radiculopathy.  I think this is more likely a peripheral nerve issue.  To help work this up given his weakness, I recommended MRI scan of the lumbar spine as well as nerve conduction study.   I spent a total of 20 minutes in face-to-face and non-face-to-face activities related to this patient's care today.  Thank you for involving me in the care of this patient.      Robert Fields K. Robert Ribas MD, [facility] Neurosurgery

## 2022-06-23 ENCOUNTER — Ambulatory Visit
Admission: RE | Admit: 2022-06-23 | Discharge: 2022-06-23 | Disposition: A | Discharge status: Home / Self Care | Payer: Medicare PPO | Source: Ambulatory Visit | Attending: Neurosurgery | Admitting: Neurosurgery

## 2022-06-23 DIAGNOSIS — M21371 Foot drop, right foot: Secondary | ICD-10-CM | POA: Diagnosis present

## 2022-06-27 NOTE — Telephone Encounter (Signed)
I called [facility]rology they did receive the referral but it seems like the patient was never contacted. They will call the patient now. Next opening would be 08/12/2022.

## 2022-07-16 NOTE — Telephone Encounter (Signed)
I called [facility] and scheduled patient EMG for 08/20/2022. He is aware of the appointment.

## 2022-08-27 NOTE — Telephone Encounter (Signed)
Results are not in [facility] care everywhere yet.

## 2022-09-01 NOTE — Telephone Encounter (Signed)
His EMG results are in care everywhere, will he need an in person visit or telephone?

## 2022-09-10 NOTE — Telephone Encounter (Signed)
I called [facility] requesting the EMG results yesterday. They have not been signed that is why the results are not in his chart. She was supposed to fax the results over yesterday but nothing has been received.

## 2022-09-11 NOTE — Telephone Encounter (Signed)
Results are in care everywhere.

## 2022-09-12 NOTE — Telephone Encounter (Signed)
He confirmed telephone visit on 2/27 at 12:45pm.

## 2022-09-16 ENCOUNTER — Ambulatory Visit (INDEPENDENT_AMBULATORY_CARE_PROVIDER_SITE_OTHER): Payer: Medicare PPO | Admitting: Neurosurgery

## 2022-09-16 DIAGNOSIS — G629 Polyneuropathy, unspecified: Secondary | ICD-10-CM

## 2022-09-16 NOTE — Progress Notes (Signed)
Referring Physician:  Adin Hector, MD [facility] [place],  [place] [address]  Primary Physician:  Adin Hector, MD  Procedure: C3-4 ACDF Date of procedure: 10/12/2019 Diagnosis: Cervical myelopathy   History of Present Illness: 09/16/2022 Telephone visit to review his EMG.  06/10/2022 [name] is here today with a chief complaint of numbness in his right foot.  He primarily has numbness in his midfoot on the top of his right foot.  This been present for a couple of years.  He also gets some discomfort around the right lateral malleolus.  This has been worsening and bothering him more when he walks.  He has some tingling on the bottom of his foot as well on the right side.  He has no symptoms above his mid calf.  He has no buttock pain.  He has noted some weakness in dorsiflexion of his right ankle over the past couple of years.   Bowel/Bladder Dysfunction: none  Conservative measures:  Physical therapy: none  Multimodal medical therapy including regular antiinflammatories: none  Injections: no epidural steroid injections  Past Surgery: see above  Robert Fields has no symptoms of cervical myelopathy.  The symptoms are causing a significant impact on the patient's life.   Review of Systems:  A 10 point review of systems is negative, except for the pertinent positives and negatives detailed in the HPI.  Past Medical History: Past Medical History:  Diagnosis Date   CAD (coronary artery disease)    History of kidney stones    Hyperlipemia    Hypertension    MI (myocardial infarction) ([place])     Past Surgical History: Past Surgical History:  Procedure Laterality Date   ANTERIOR CERVICAL DECOMP/DISCECTOMY FUSION N/A 10/12/2019   Procedure: ANTERIOR CERVICAL DECOMPRESSION/DISCECTOMY FUSION 1 LEVEL C3-4;  Surgeon: Meade Maw, MD;  Location: [facility];  Service: Neurosurgery;  Laterality: N/A;   CARDIAC CATHETERIZATION      CORONARY ARTERY BYPASS GRAFT  2004   5 vessel   CYSTOSCOPY/RETROGRADE/URETEROSCOPY Left 02/08/2018   Procedure: CYSTOSCOPY/RETROGRADE/URETEROSCOPY;  Surgeon: Hollice Espy, MD;  Location: [facility];  Service: Urology;  Laterality: Left;   CYSTOSCOPY/URETEROSCOPY/HOLMIUM LASER/STENT PLACEMENT Right 03/29/2018   Procedure: CYSTOSCOPY/URETEROSCOPY/HOLMIUM LASER/STENT PLACEMENT;  Surgeon: Hollice Espy, MD;  Location: [facility];  Service: Urology;  Laterality: Right;  antegrade vs retrograde   CYSTOSCOPY/URETEROSCOPY/HOLMIUM LASER/STENT PLACEMENT Right 04/12/2018   Procedure: CYSTOSCOPY/URETEROSCOPY/HOLMIUM LASER/STENT EXCHANGE;  Surgeon: Hollice Espy, MD;  Location: [facility];  Service: Urology;  Laterality: Right;  antegrade vs retrograde   IR CONVERT RIGHT NEPHROSTOMY TO NEPHROURETERAL CATH  03/17/2018   IR NEPHROSTOMY PLACEMENT RIGHT  02/11/2018    Allergies: Allergies as of 09/16/2022 - Review Complete 06/10/2022  Allergen Reaction Noted   Doxycycline hyclate Rash 12/13/2013    Medications: No outpatient medications have been marked as taking for the 09/16/22 encounter (Appointment) with Meade Maw, MD.    Social History: Social History   Tobacco Use   Smoking status: Never   Smokeless tobacco: Never  Vaping Use   Vaping Use: Never used  Substance Use Topics   Alcohol use: Yes    Alcohol/week: 2.0 standard drinks of alcohol    Types: 2 Cans of beer per week   Drug use: Never    Family Medical History: Family History  Problem Relation Age of Onset   Heart failure Mother    Lung cancer Mother     Physical Examination: Telephone visit  Medical Decision  Making  Imaging: N/a  I have personally reviewed the images and agree with the above interpretation.  EMG 08/20/22 Impression: Abnormal study. 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   Thank you for the referral of this patient. It was our  privilege to participate in care of your patient. Feel free to contact us with any further questions.  _____________________________ Gurney Maxin, M.D.    Assessment and Plan: Robert Fields is a pleasant 72 y.o. male with findings consistent with peripheral neuropathy.  I do not have a good explanation for why this impacted his right ankle and motor function of his right ankle more so than any other part of his body, as the findings were symmetric on his nerve conduction study.  That being said, he is currently stable.  He will continue under current management.  If he begins to have irritating numbness or tingling, he does have the option of neuropathic pain medications.  I will see him back on an as-needed basis.   This visit was performed via telephone.  Patient location: home Provider location: office  I spent a total of 5 minutes non-face-to-face activities for this visit on the date of this encounter including review of current clinical condition and response to treatment.  The patient is aware of and accepts the limits of this telehealth visit.      Robert Fields K. Izora Ribas MD, [facility] Neurosurgery

## 2023-01-06 ENCOUNTER — Emergency Department
Admission: EM | Admit: 2023-01-06 | Discharge: 2023-01-06 | Disposition: A | Discharge status: Home / Self Care | Payer: Medicare PPO | Attending: Emergency Medicine | Admitting: Emergency Medicine

## 2023-01-06 ENCOUNTER — Emergency Department: Payer: Medicare PPO

## 2023-01-06 ENCOUNTER — Other Ambulatory Visit: Payer: Self-pay

## 2023-01-06 DIAGNOSIS — I10 Essential (primary) hypertension: Secondary | ICD-10-CM

## 2023-01-06 DIAGNOSIS — Z951 Presence of aortocoronary bypass graft: Secondary | ICD-10-CM | POA: Diagnosis not present

## 2023-01-06 DIAGNOSIS — I2510 Atherosclerotic heart disease of native coronary artery without angina pectoris: Secondary | ICD-10-CM | POA: Insufficient documentation

## 2023-01-06 DIAGNOSIS — R519 Headache, unspecified: Secondary | ICD-10-CM | POA: Diagnosis present

## 2023-01-06 LAB — BASIC METABOLIC PANEL
Anion gap: 10 (ref 5–15)
BUN: 13 mg/dL (ref 8–23)
CO2: 23 mmol/L (ref 22–32)
Calcium: 8.7 mg/dL — ABNORMAL LOW (ref 8.9–10.3)
Chloride: 99 mmol/L (ref 98–111)
Creatinine, Ser: 0.97 mg/dL (ref 0.61–1.24)
GFR, Estimated: >60 mL/min (ref >60)
Glucose, Bld: 118 mg/dL — ABNORMAL HIGH (ref 70–99)
Potassium: 3.6 mmol/L (ref 3.5–5.1)
Sodium: 132 mmol/L — ABNORMAL LOW (ref 135–145)

## 2023-01-06 LAB — CBC
HCT: 44.3 % (ref 39.0–52.0)
Hemoglobin: 15.1 g/dL (ref 13.0–17.0)
MCH: 31.3 pg (ref 26.0–34.0)
MCHC: 34.1 g/dL (ref 30.0–36.0)
MCV: 91.9 fL (ref 80.0–100.0)
Platelets: 195 10*3/uL (ref 150–400)
RBC: 4.82 MIL/uL (ref 4.22–5.81)
RDW: 12.1 % (ref 11.5–15.5)
WBC: 3.9 10*3/uL — ABNORMAL LOW (ref 4.0–10.5)
nRBC: 0 % (ref 0.0–0.2)

## 2023-01-06 LAB — TROPONIN I (HIGH SENSITIVITY): Troponin I (High Sensitivity): 6 ng/L (ref <18)

## 2023-01-06 NOTE — ED Provider Triage Note (Signed)
Emergency Medicine Provider Triage Evaluation Note  Robert Fields , a 72 y.o. male  was evaluated in triage.  Pt complains of elevated blood pressure at home and feeling "woozy". Taking medication as prescribed.   Review of Systems  Positive: "Woozy" Negative: No CP or SOB  Physical Exam  BP (!) 188/112   Pulse 80   Temp 97.9 F (36.6 C) (Oral)   Resp 18   Ht 6\' 1"  (1.854 m)   Wt 105.7 kg   SpO2 99%   BMI 30.74 kg/m  Gen:   Awake, no distress   Resp:  Normal effort  MSK:   Moves extremities without difficulty  Other:    Medical Decision Making  Medically screening exam initiated at 12:34 PM.  Appropriate orders placed.  Robert Fields was informed that the remainder of the evaluation will be completed by another provider, this initial triage assessment does not replace that evaluation, and the importance of remaining in the ED until their evaluation is complete.     Tommi Rumps, PA-C 01/06/23 1235

## 2023-01-06 NOTE — ED Triage Notes (Signed)
Pt here with hypertension. Pt has a hx of htn. Pt states bp at home was 193/122. Pt ambulatory to triage. Pt denies pain.

## 2023-01-06 NOTE — ED Provider Notes (Signed)
[facility] Provider Note    Event Date/Time   First MD Initiated Contact with Patient 01/06/23 1342     (approximate)   History   Chief Complaint Hypertension   HPI  Robert Fields is a 72 y.o. male with past medical history of hypertension, hyperlipidemia, and CAD status post CABG who presents to the ED complaining of hypertension.  Patient reports that he was "feeling off" earlier today with some "heaviness" in his head.  He decided to check his blood pressure and found it to be in the 190s over 110s.  He denies any vision changes, speech changes, numbness, weakness, chest pain, shortness of breath, or difficulty urinating.  He does admit that he did not take his blood pressure medication for 2 days over the weekend, but did take his usual dose last night.  He states that he is now feeling better and thinks his blood pressure has improved.  He is currently asymptomatic, denies any ongoing headache or other neurologic symptoms.  He denies any recent changes in his blood pressure medications.     Physical Exam   Triage Vital Signs: ED Triage Vitals  Enc Vitals Group     BP 01/06/23 1225 (!) 202/119     Pulse Rate 01/06/23 1225 80     Resp 01/06/23 1225 18     Temp 01/06/23 1225 97.9 F (36.6 C)     Temp Source 01/06/23 1225 Oral     SpO2 01/06/23 1225 99 %     Weight 01/06/23 1226 233 lb 0.4 oz (105.7 kg)     Height 01/06/23 1226 6\' 1"  (1.854 m)     Head Circumference --      Peak Flow --      Pain Score 01/06/23 1226 0     Pain Loc --      Pain Edu? --      Excl. in GC? --     Most recent vital signs: Vitals:   01/06/23 1226 01/06/23 1432  BP: (!) 188/112 (!) 156/102  Pulse:  81  Resp:  18  Temp:    SpO2:  99%    Constitutional: Alert and oriented. Eyes: Conjunctivae are normal. Head: Atraumatic. Nose: No congestion/rhinnorhea. Mouth/Throat: Mucous membranes are moist.  Cardiovascular: Normal rate, regular rhythm. Grossly normal  heart sounds.  2+ radial pulses bilaterally. Respiratory: Normal respiratory effort.  No retractions. Lungs CTAB. Gastrointestinal: Soft and nontender. No distention. Musculoskeletal: No lower extremity tenderness nor edema.  Neurologic:  Normal speech and language. No gross focal neurologic deficits are appreciated.    ED Results / Procedures / Treatments   Labs (all labs ordered are listed, but only abnormal results are displayed) Labs Reviewed  BASIC METABOLIC PANEL - Abnormal; Notable for the following components:      Result Value   Sodium 132 (*)    Glucose, Bld 118 (*)    Calcium 8.7 (*)    All other components within normal limits  CBC - Abnormal; Notable for the following components:   WBC 3.9 (*)    All other components within normal limits  TROPONIN I (HIGH SENSITIVITY)     EKG  ED ECG REPORT I, Chesley Noon, the attending physician, personally viewed and interpreted this ECG.   Date: 01/06/2023  EKG Time: 12:31  Rate: 85  Rhythm: normal sinus rhythm  Axis: Normal  Intervals:none  ST&T Change: None  RADIOLOGY Chest x-ray reviewed and interpreted by me with no infiltrate, edema, or  effusion.  PROCEDURES:  Critical Care performed: No  Procedures   MEDICATIONS ORDERED IN ED: Medications - No data to display   IMPRESSION / MDM / ASSESSMENT AND PLAN / ED COURSE  I reviewed the triage vital signs and the nursing notes.                              72 y.o. male with past medical history of hypertension, hyperlipidemia, and CAD who presents to the ED complaining of elevated blood pressure and "feeling off" earlier today.  Patient's presentation is most consistent with acute presentation with potential threat to life or bodily function.  Differential diagnosis includes, but is not limited to, stroke, ACS, electrolyte abnormality, AKI, uncontrolled hypertension, medication noncompliance.  Patient well-appearing and in no acute distress, vital signs  remarkable for elevated blood pressure but otherwise reassuring.  Patient reports that his heaviness in his head has resolved, denies any symptoms currently and has a nonfocal neurologic exam.  He does admit to not taking his blood pressure medications for a couple of days up until last night.  Blood pressure improving on recheck and labs are reassuring with no significant anemia, leukocytosis, show abnormality, or AKI.  Troponin within normal limits.  EKG shows no evidence of arrhythmia or ischemia.  Given reassuring neurologic exam, do not feel CT head indicated, doubt stroke or SAH.  He is appropriate for discharge home with PCP follow-up for recheck of his blood pressure.  He was counseled to return to the ED for new or worsening symptoms.  Patient agrees with plan.      FINAL CLINICAL IMPRESSION(S) / ED DIAGNOSES   Final diagnoses:  Uncontrolled hypertension     Rx / DC Orders   ED Discharge Orders     None        Note:  This document was prepared using Dragon voice recognition software and may include unintentional dictation errors.   Chesley Noon, MD 01/06/23 [phone]

## 2023-01-24 ENCOUNTER — Encounter: Payer: Self-pay | Admitting: Radiology

## 2023-01-24 ENCOUNTER — Emergency Department
Admission: EM | Admit: 2023-01-24 | Discharge: 2023-01-24 | Disposition: A | Discharge status: Home / Self Care | Payer: Medicare PPO | Attending: Emergency Medicine | Admitting: Emergency Medicine

## 2023-01-24 ENCOUNTER — Other Ambulatory Visit: Payer: Self-pay

## 2023-01-24 ENCOUNTER — Emergency Department: Payer: Medicare PPO

## 2023-01-24 DIAGNOSIS — I10 Essential (primary) hypertension: Secondary | ICD-10-CM | POA: Insufficient documentation

## 2023-01-24 DIAGNOSIS — Z7982 Long term (current) use of aspirin: Secondary | ICD-10-CM | POA: Diagnosis not present

## 2023-01-24 DIAGNOSIS — R030 Elevated blood-pressure reading, without diagnosis of hypertension: Secondary | ICD-10-CM | POA: Diagnosis present

## 2023-01-24 DIAGNOSIS — R079 Chest pain, unspecified: Secondary | ICD-10-CM | POA: Diagnosis not present

## 2023-01-24 LAB — BASIC METABOLIC PANEL
Anion gap: 11 (ref 5–15)
BUN: 14 mg/dL (ref 8–23)
CO2: 23 mmol/L (ref 22–32)
Calcium: 9.1 mg/dL (ref 8.9–10.3)
Chloride: 99 mmol/L (ref 98–111)
Creatinine, Ser: 1.03 mg/dL (ref 0.61–1.24)
GFR, Estimated: >60 mL/min (ref >60)
Glucose, Bld: 130 mg/dL — ABNORMAL HIGH (ref 70–99)
Potassium: 3.8 mmol/L (ref 3.5–5.1)
Sodium: 133 mmol/L — ABNORMAL LOW (ref 135–145)

## 2023-01-24 LAB — CBC
HCT: 43.5 % (ref 39.0–52.0)
Hemoglobin: 15.4 g/dL (ref 13.0–17.0)
MCH: 31.6 pg (ref 26.0–34.0)
MCHC: 35.4 g/dL (ref 30.0–36.0)
MCV: 89.1 fL (ref 80.0–100.0)
Platelets: 222 10*3/uL (ref 150–400)
RBC: 4.88 MIL/uL (ref 4.22–5.81)
RDW: 11.7 % (ref 11.5–15.5)
WBC: 6.4 10*3/uL (ref 4.0–10.5)
nRBC: 0 % (ref 0.0–0.2)

## 2023-01-24 LAB — TROPONIN I (HIGH SENSITIVITY): Troponin I (High Sensitivity): 7 ng/L (ref <18)

## 2023-01-24 NOTE — Discharge Instructions (Addendum)
Please take your blood pressure at least 1 hour after taking your blood pressure medications for the next week.  If the top number (systolic) is higher than 150 consistently, please contact your primary care physician for further management of your blood pressure medications

## 2023-01-24 NOTE — ED Provider Notes (Signed)
[facility] Provider Note   Event Date/Time   First MD Initiated Contact with Patient 01/24/23 1603     (approximate) History  Hypertension and Chest Pain  HPI Robert Fields is a 72 y.o. male with past medical history of hypertension who presents with complaints of hypertension.  Patient states that he was out in the heat earlier today when he felt "washed out".  Patient then went inside and took his blood pressure finding that it was 206/115.  Patient tried to take a shower and drinking some Gatorade which did not improve his symptoms and presented to the fire department where he was found to be hypertensive once again.  Patient states that he has taken his antihypertensive this for today.  Patient's wife gave him 81 mg of aspirin.  Patient states that the pain in his chest has resolved at this time ROS: Patient currently denies any vision changes, tinnitus, difficulty speaking, facial droop, sore throat, chest pain, shortness of breath, abdominal pain, nausea/vomiting/diarrhea, dysuria, or weakness/numbness/paresthesias in any extremity   Physical Exam  Triage Vital Signs: ED Triage Vitals  Enc Vitals Group     BP 01/24/23 1413 (!) 162/111     Pulse Rate 01/24/23 1413 87     Resp 01/24/23 1413 18     Temp 01/24/23 1413 98 F (36.7 C)     Temp Source 01/24/23 1413 Oral     SpO2 01/24/23 1413 99 %     Weight 01/24/23 1411 230 lb (104.3 kg)     Height 01/24/23 1411 6\' 1"  (1.854 m)     Head Circumference --      Peak Flow --      Pain Score --      Pain Loc --      Pain Edu? --      Excl. in GC? --    Most recent vital signs: Vitals:   01/24/23 1413 01/24/23 1753  BP: (!) 162/111 (!) 152/104  Pulse: 87 78  Resp: 18 16  Temp: 98 F (36.7 C) 98.2 F (36.8 C)  SpO2: 99% 100%   General: Awake, oriented x4. CV:  Good peripheral perfusion.  Resp:  Normal effort.  Abd:  No distention.  Other:  Elderly overweight Caucasian male laying in bed in no  acute distress ED Results / Procedures / Treatments  Labs (all labs ordered are listed, but only abnormal results are displayed) Labs Reviewed  BASIC METABOLIC PANEL - Abnormal; Notable for the following components:      Result Value   Sodium 133 (*)    Glucose, Bld 130 (*)    All other components within normal limits  CBC  TROPONIN I (HIGH SENSITIVITY)   EKG ED ECG REPORT I, Merwyn Katos, the attending physician, personally viewed and interpreted this ECG. Date: 01/24/2023 EKG Time: 1418 Rate: 84 Rhythm: normal sinus rhythm QRS Axis: normal Intervals: normal ST/T Wave abnormalities: normal Narrative Interpretation: no evidence of acute ischemia RADIOLOGY ED MD interpretation: 2 view chest x-ray interpreted by me shows no evidence of acute abnormalities including no pneumonia, pneumothorax, or widened mediastinum -Agree with radiology assessment Official radiology report(s): DG Chest 2 View  Result Date: 01/24/2023 CLINICAL DATA:  Chest pain EXAM: CHEST - 2 VIEW COMPARISON:  01/06/2023 FINDINGS: The heart size and mediastinal contours are within normal limits. Prior sternotomy and CABG. Aortic atherosclerosis. Both lungs are clear. The visualized skeletal structures are unremarkable. IMPRESSION: No active cardiopulmonary disease. Electronically Signed  By: Duanne Guess D.O.   On: 01/24/2023 15:02   PROCEDURES: Critical Care performed: No .1-3 Lead EKG Interpretation  Performed by: Merwyn Katos, MD Authorized by: Merwyn Katos, MD     Interpretation: normal     ECG rate:  71   ECG rate assessment: normal     Rhythm: sinus rhythm     Ectopy: none     Conduction: normal    MEDICATIONS ORDERED IN ED: Medications - No data to display IMPRESSION / MDM / ASSESSMENT AND PLAN / ED COURSE  I reviewed the triage vital signs and the nursing notes.                             The patient is on the cardiac monitor to evaluate for evidence of arrhythmia and/or  significant heart rate changes. Patient's presentation is most consistent with acute presentation with potential threat to life or bodily function. Presents to the emergency department complaining of high blood pressure. Patient is otherwise asymptomatic without confusion, chest pain, hematuria, or SOB. Denies nonadherence to antihypertensive regimen DDx: CV, AMI, heart failure, renal infarction or failure or other end organ damage.  Disposition: Discussed with patient their elevated blood pressure and need for close outpatient management of their hypertension. Will provide a prescription for the patients previous antihypertensive medication and arrange for the patient to follow up in a primary care clinic    FINAL CLINICAL IMPRESSION(S) / ED DIAGNOSES   Final diagnoses:  Hypertension, unspecified type  Chest pain, unspecified type   Rx / DC Orders   ED Discharge Orders          Ordered    Ambulatory referral to Cardiology       Comments: If you have not heard from the Cardiology office within the next 72 hours please call [phone].   01/24/23 1746           Note:  This document was prepared using Dragon voice recognition software and may include unintentional dictation errors.   Merwyn Katos, MD 01/24/23 [phone]

## 2023-01-24 NOTE — ED Triage Notes (Signed)
Pt states he cooked on the grill around 10am and felt washed out from the heat. He went inside and waited a little while and took his BP again and it was still high at 206/115. Pt went to the local fire department and had them take it and it remained very high. Pt take Losartan 50mg  for BP, last taken last night. Pt wife gave him a 81mg  ASA for his BP. Pt also endorses having a feeling of hotness in his chest.

## 2023-04-02 ENCOUNTER — Ambulatory Visit
Admission: RE | Admit: 2023-04-02 | Discharge: 2023-04-02 | Disposition: A | Discharge status: Home / Self Care | Payer: Medicare PPO | Attending: Internal Medicine | Admitting: Internal Medicine

## 2023-04-02 ENCOUNTER — Other Ambulatory Visit: Payer: Self-pay

## 2023-04-02 ENCOUNTER — Encounter
Admission: RE | Disposition: A | Discharge status: Home / Self Care | Payer: Self-pay | Source: Home / Self Care | Attending: Internal Medicine

## 2023-04-02 ENCOUNTER — Encounter: Payer: Self-pay | Admitting: Internal Medicine

## 2023-04-02 DIAGNOSIS — I2510 Atherosclerotic heart disease of native coronary artery without angina pectoris: Secondary | ICD-10-CM | POA: Diagnosis not present

## 2023-04-02 DIAGNOSIS — Z87891 Personal history of nicotine dependence: Secondary | ICD-10-CM | POA: Insufficient documentation

## 2023-04-02 DIAGNOSIS — I2584 Coronary atherosclerosis due to calcified coronary lesion: Secondary | ICD-10-CM | POA: Insufficient documentation

## 2023-04-02 DIAGNOSIS — Z951 Presence of aortocoronary bypass graft: Secondary | ICD-10-CM | POA: Insufficient documentation

## 2023-04-02 DIAGNOSIS — Z79899 Other long term (current) drug therapy: Secondary | ICD-10-CM | POA: Diagnosis not present

## 2023-04-02 DIAGNOSIS — I10 Essential (primary) hypertension: Secondary | ICD-10-CM | POA: Insufficient documentation

## 2023-04-02 DIAGNOSIS — R9439 Abnormal result of other cardiovascular function study: Secondary | ICD-10-CM | POA: Diagnosis present

## 2023-04-02 DIAGNOSIS — Z7982 Long term (current) use of aspirin: Secondary | ICD-10-CM | POA: Insufficient documentation

## 2023-04-02 DIAGNOSIS — I2582 Chronic total occlusion of coronary artery: Secondary | ICD-10-CM | POA: Insufficient documentation

## 2023-04-02 HISTORY — PX: LEFT HEART CATH AND CORS/GRAFTS ANGIOGRAPHY: CATH118250

## 2023-04-02 SURGERY — LEFT HEART CATH AND CORS/GRAFTS ANGIOGRAPHY
Anesthesia: Moderate Sedation

## 2023-04-02 MED ORDER — HEPARIN (PORCINE) IN NACL 1000-0.9 UT/500ML-% IV SOLN
INTRAVENOUS | Status: AC
  Filled 2023-04-02: qty 1000

## 2023-04-02 MED ORDER — SODIUM CHLORIDE 0.9 % IV SOLN: 250.0000 mL | INTRAVENOUS | Status: DC | PRN

## 2023-04-02 MED ORDER — SODIUM CHLORIDE 0.9% FLUSH: 3.0000 mL | Freq: Two times a day (BID) | INTRAVENOUS | Status: DC

## 2023-04-02 MED ORDER — LABETALOL HCL 5 MG/ML IV SOLN
10.0000 mg | INTRAVENOUS | Status: DC | PRN
  Administered 2023-04-02: 10 mg via INTRAVENOUS

## 2023-04-02 MED ORDER — SODIUM CHLORIDE 0.9 % WEIGHT BASED INFUSION: 1.0000 mL/kg/h | INTRAVENOUS | Status: DC

## 2023-04-02 MED ORDER — LABETALOL HCL 5 MG/ML IV SOLN
INTRAVENOUS | Status: AC
  Filled 2023-04-02: qty 4

## 2023-04-02 MED ORDER — SODIUM CHLORIDE 0.9 % WEIGHT BASED INFUSION
3.0000 mL/kg/h | INTRAVENOUS | Status: AC
  Administered 2023-04-02: 3 mL/kg/h via INTRAVENOUS

## 2023-04-02 MED ORDER — SODIUM CHLORIDE 0.9% FLUSH: 3.0000 mL | INTRAVENOUS | Status: DC | PRN

## 2023-04-02 MED ORDER — FENTANYL CITRATE (PF) 100 MCG/2ML IJ SOLN
INTRAMUSCULAR | Status: DC | PRN
  Administered 2023-04-02: 25 ug via INTRAVENOUS

## 2023-04-02 MED ORDER — IOHEXOL 300 MG/ML  SOLN
INTRAMUSCULAR | Status: DC | PRN
  Administered 2023-04-02: 150 mL

## 2023-04-02 MED ORDER — LIDOCAINE HCL (PF) 1 % IJ SOLN
INTRAMUSCULAR | Status: DC | PRN
  Administered 2023-04-02: 15 mL

## 2023-04-02 MED ORDER — FENTANYL CITRATE (PF) 100 MCG/2ML IJ SOLN
INTRAMUSCULAR | Status: AC
  Filled 2023-04-02: qty 2

## 2023-04-02 MED ORDER — HEPARIN (PORCINE) IN NACL 2000-0.9 UNIT/L-% IV SOLN
INTRAVENOUS | Status: DC | PRN
  Administered 2023-04-02: 1000 mL

## 2023-04-02 MED ORDER — HYDRALAZINE HCL 20 MG/ML IJ SOLN: 10.0000 mg | INTRAMUSCULAR | Status: DC | PRN

## 2023-04-02 MED ORDER — ONDANSETRON HCL 4 MG/2ML IJ SOLN: 4.0000 mg | Freq: Four times a day (QID) | INTRAMUSCULAR | Status: DC | PRN

## 2023-04-02 MED ORDER — ACETAMINOPHEN 325 MG PO TABS: 650.0000 mg | ORAL_TABLET | ORAL | Status: DC | PRN

## 2023-04-02 MED ORDER — ASPIRIN 81 MG PO CHEW
CHEWABLE_TABLET | ORAL | Status: AC
  Filled 2023-04-02: qty 1

## 2023-04-02 MED ORDER — ASPIRIN 81 MG PO CHEW
81.0000 mg | CHEWABLE_TABLET | ORAL | Status: AC
  Administered 2023-04-02: 81 mg via ORAL

## 2023-04-02 MED ORDER — MIDAZOLAM HCL 2 MG/2ML IJ SOLN
INTRAMUSCULAR | Status: AC
  Filled 2023-04-02: qty 2

## 2023-04-02 MED ORDER — LIDOCAINE HCL 1 % IJ SOLN
INTRAMUSCULAR | Status: AC
  Filled 2023-04-02: qty 20

## 2023-04-02 MED ORDER — MIDAZOLAM HCL 2 MG/2ML IJ SOLN
INTRAMUSCULAR | Status: DC | PRN
  Administered 2023-04-02: 1 mg via INTRAVENOUS

## 2023-04-02 SURGICAL SUPPLY — 20 items
CATH INFINITI 5 FR AL2 (CATHETERS) ×1 IMPLANT
CATH INFINITI 5 FR JL3.5 (CATHETERS) ×1 IMPLANT
CATH INFINITI 5 FR MPA2 (CATHETERS) ×1 IMPLANT
CATH INFINITI 5FR AL1 (CATHETERS) ×1 IMPLANT
CATH INFINITI 5FR JL5 (CATHETERS) ×1 IMPLANT
CATH INFINITI 5FR MULTPACK ANG (CATHETERS) ×1 IMPLANT
CATH LAUNCHER 5F EBU3.5 (CATHETERS) ×1 IMPLANT
CATH LAUNCHER 6FR EBU3.5 (CATHETERS) IMPLANT
CATH VISTA GUIDE 6FR XB4 (CATHETERS) ×1 IMPLANT
DEVICE CLOSURE MYNXGRIP 6/7F (Vascular Products) ×1 IMPLANT
DRAPE BRACHIAL (DRAPES) IMPLANT
NDL PERC 18GX7CM (NEEDLE) IMPLANT
NEEDLE PERC 18GX7CM (NEEDLE) ×2 IMPLANT
PACK CARDIAC CATH (CUSTOM PROCEDURE TRAY) ×3 IMPLANT
PROTECTION STATION PRESSURIZED (MISCELLANEOUS) ×2
SET ATX-X65L (MISCELLANEOUS) ×1 IMPLANT
SHEATH AVANTI 5FR X 11CM (SHEATH) ×1 IMPLANT
SHEATH AVANTI 6FR X 11CM (SHEATH) ×1 IMPLANT
STATION PROTECTION PRESSURIZED (MISCELLANEOUS) ×1 IMPLANT
WIRE GUIDERIGHT .035X150 (WIRE) ×1 IMPLANT

## 2023-04-02 NOTE — Progress Notes (Signed)
Messaged PA Hudson and Dr. Juliann Pares made aware that pt came out with hematoma and that pt was having trouble urinating. No new orders at this time

## 2023-04-05 LAB — CARDIAC CATHETERIZATION: Cath EF Quantitative: 55 %

## 2023-04-06 ENCOUNTER — Encounter: Payer: Self-pay | Admitting: Internal Medicine

## 2024-05-09 ENCOUNTER — Encounter: Payer: Self-pay | Admitting: *Deleted

## 2024-05-10 ENCOUNTER — Ambulatory Visit
Admission: RE | Admit: 2024-05-10 | Discharge: 2024-05-10 | Disposition: A | Discharge status: Home / Self Care | Attending: Gastroenterology | Admitting: Gastroenterology

## 2024-05-10 ENCOUNTER — Encounter
Admission: RE | Disposition: A | Discharge status: Home / Self Care | Payer: Self-pay | Source: Home / Self Care | Attending: Gastroenterology

## 2024-05-10 ENCOUNTER — Ambulatory Visit

## 2024-05-10 DIAGNOSIS — I10 Essential (primary) hypertension: Secondary | ICD-10-CM | POA: Insufficient documentation

## 2024-05-10 DIAGNOSIS — D123 Benign neoplasm of transverse colon: Secondary | ICD-10-CM | POA: Insufficient documentation

## 2024-05-10 DIAGNOSIS — D128 Benign neoplasm of rectum: Secondary | ICD-10-CM | POA: Insufficient documentation

## 2024-05-10 DIAGNOSIS — Z1211 Encounter for screening for malignant neoplasm of colon: Secondary | ICD-10-CM | POA: Insufficient documentation

## 2024-05-10 DIAGNOSIS — I252 Old myocardial infarction: Secondary | ICD-10-CM | POA: Diagnosis not present

## 2024-05-10 DIAGNOSIS — K5730 Diverticulosis of large intestine without perforation or abscess without bleeding: Secondary | ICD-10-CM | POA: Diagnosis not present

## 2024-05-10 DIAGNOSIS — D124 Benign neoplasm of descending colon: Secondary | ICD-10-CM | POA: Insufficient documentation

## 2024-05-10 DIAGNOSIS — K640 First degree hemorrhoids: Secondary | ICD-10-CM | POA: Insufficient documentation

## 2024-05-10 DIAGNOSIS — I2510 Atherosclerotic heart disease of native coronary artery without angina pectoris: Secondary | ICD-10-CM | POA: Insufficient documentation

## 2024-05-10 HISTORY — PX: COLONOSCOPY: SHX5424

## 2024-05-10 HISTORY — PX: POLYPECTOMY: SHX149

## 2024-05-10 SURGERY — COLONOSCOPY
Anesthesia: General

## 2024-05-10 MED ORDER — LIDOCAINE HCL (CARDIAC) PF 100 MG/5ML IV SOSY
PREFILLED_SYRINGE | INTRAVENOUS | Status: DC | PRN
  Administered 2024-05-10: 60 mg via INTRAVENOUS

## 2024-05-10 MED ORDER — LIDOCAINE HCL (PF) 2 % IJ SOLN
INTRAMUSCULAR | Status: AC
  Filled 2024-05-10: qty 5

## 2024-05-10 MED ORDER — EPHEDRINE SULFATE-NACL 50-0.9 MG/10ML-% IV SOSY
PREFILLED_SYRINGE | INTRAVENOUS | Status: DC | PRN
Start: 2024-05-10 — End: 2024-05-10
  Administered 2024-05-10: 10 mg via INTRAVENOUS
  Administered 2024-05-10: 5 mg via INTRAVENOUS

## 2024-05-10 MED ORDER — PROPOFOL 500 MG/50ML IV EMUL
INTRAVENOUS | Status: DC | PRN
Start: 2024-05-10 — End: 2024-05-10
  Administered 2024-05-10: 150 ug/kg/min via INTRAVENOUS

## 2024-05-10 MED ORDER — SODIUM CHLORIDE 0.9 % IV SOLN: INTRAVENOUS | Status: DC

## 2024-05-10 MED ORDER — PROPOFOL 10 MG/ML IV BOLUS
INTRAVENOUS | Status: DC | PRN
  Administered 2024-05-10: 100 mg via INTRAVENOUS

## 2024-05-10 NOTE — H&P (Signed)
 Outpatient short stay form Pre-procedure 05/10/2024  Robert ONEIDA Schick, MD  Primary Physician: Fernande Ophelia JINNY DOUGLAS, MD  Reason for visit:  Surveillance  History of present illness:    73 y/o gentleman with history of advanced polyp, hypertension, and obesity here for surveillance colonoscopy. No blood thinners. No family history of GI malignancies. No significant abdominal surgeries.    Current Facility-Administered Medications:    0.9 %  sodium chloride  infusion, , Intravenous, Continuous, Isabellah Sobocinski, Robert ONEIDA, MD, Last Rate: 20 mL/hr at 05/10/24 0849, New Bag at 05/10/24 0849  Medications Prior to Admission  Medication Sig Dispense Refill Last Dose/Taking   aspirin  EC 81 MG tablet Take 81 mg by mouth daily. Swallow whole.   Past Week   ezetimibe (ZETIA) 10 MG tablet Take 10 mg by mouth at bedtime.    05/09/2024   losartan (COZAAR) 25 MG tablet Take 50 mg by mouth at bedtime.    05/09/2024   metoprolol succinate (TOPROL-XL) 25 MG 24 hr tablet Take 12.5 mg by mouth daily.   05/09/2024   rosuvastatin (CRESTOR) 40 MG tablet Take 40 mg by mouth at bedtime.   1 05/09/2024   VITAMIN B1-B12 IM Inject 1,000 mcg into the muscle every 30 (thirty) days.   Past Week     Allergies  Allergen Reactions   Doxycycline Hyclate Rash     Past Medical History:  Diagnosis Date   CAD (coronary artery disease)    History of kidney stones    Hyperlipemia    Hypertension    MI (myocardial infarction) (HCC)     Review of systems:  Otherwise negative.    Physical Exam  Gen: Alert, oriented. Appears stated age.  HEENT: PERRLA. Lungs: No respiratory distress CV: RRR Abd: soft, benign, no masses Ext: No edema    Planned procedures: Proceed with colonoscopy. The patient understands the nature of the planned procedure, indications, risks, alternatives and potential complications including but not limited to bleeding, infection, perforation, damage to internal organs and possible  oversedation/side effects from anesthesia. The patient agrees and gives consent to proceed.  Please refer to procedure notes for findings, recommendations and patient disposition/instructions.     Robert ONEIDA Schick, MD [facility]

## 2024-05-10 NOTE — Anesthesia Postprocedure Evaluation (Signed)
 Anesthesia Post Note  Patient: Robert Fields  Procedure(s) Performed: COLONOSCOPY POLYPECTOMY, INTESTINE  Patient location during evaluation: PACU Anesthesia Type: General Level of consciousness: awake and alert Pain management: pain level controlled Vital Signs Assessment: post-procedure vital signs reviewed and stable Respiratory status: spontaneous breathing, nonlabored ventilation, respiratory function stable and patient connected to nasal cannula oxygen Cardiovascular status: blood pressure returned to baseline and stable Postop Assessment: no apparent nausea or vomiting Anesthetic complications: no   No notable events documented.   Last Vitals:  Vitals:   05/10/24 1038 05/10/24 1048  BP: 121/86 119/87  Pulse: 87 81  Resp: (!) 24 (!) 26  Temp:    SpO2: 97% 98%    Last Pain:  Vitals:   05/10/24 1038  TempSrc:   PainSc: 0-No pain                 Lynwood KANDICE Clause

## 2024-05-10 NOTE — Anesthesia Preprocedure Evaluation (Signed)
 Anesthesia Evaluation  Patient identified by MRN, date of birth, ID band Patient awake    Reviewed: Allergy & Precautions, H&P , NPO status , Patient's Chart, lab work & pertinent test results, reviewed documented beta blocker date and time   Airway Mallampati: II   Neck ROM: full    Dental  (+) Poor Dentition   Pulmonary neg pulmonary ROS   Pulmonary exam normal        Cardiovascular Exercise Tolerance: Poor hypertension, On Medications + CAD and + Past MI  Normal cardiovascular exam Rhythm:regular Rate:Normal     Neuro/Psych negative neurological ROS  negative psych ROS   GI/Hepatic negative GI ROS, Neg liver ROS,,,  Endo/Other  negative endocrine ROS    Renal/GU CRFRenal disease  negative genitourinary   Musculoskeletal   Abdominal   Peds  Hematology negative hematology ROS (+)   Anesthesia Other Findings Past Medical History: No date: CAD (coronary artery disease) No date: History of kidney stones No date: Hyperlipemia No date: Hypertension No date: MI (myocardial infarction) (HCC) Past Surgical History: 10/12/2019: ANTERIOR CERVICAL DECOMP/DISCECTOMY FUSION; N/A     Comment:  Procedure: ANTERIOR CERVICAL DECOMPRESSION/DISCECTOMY               FUSION 1 LEVEL C3-4;  Surgeon: Clois Fret, MD;                Location: [facility] ORS;  Service: Neurosurgery;  Laterality:               N/A; No date: CARDIAC CATHETERIZATION 2004: CORONARY ARTERY BYPASS GRAFT     Comment:  5 vessel 02/08/2018: CYSTOSCOPY/RETROGRADE/URETEROSCOPY; Left     Comment:  Procedure: CYSTOSCOPY/RETROGRADE/URETEROSCOPY;  Surgeon:              Penne Knee, MD;  Location: [facility] ORS;  Service:               Urology;  Laterality: Left; 03/29/2018: CYSTOSCOPY/URETEROSCOPY/HOLMIUM LASER/STENT PLACEMENT; Right     Comment:  Procedure: CYSTOSCOPY/URETEROSCOPY/HOLMIUM LASER/STENT               PLACEMENT;  Surgeon: Penne Knee, MD;   Location: [facility]              ORS;  Service: Urology;  Laterality: Right;  antegrade vs              retrograde 04/12/2018: CYSTOSCOPY/URETEROSCOPY/HOLMIUM LASER/STENT PLACEMENT;  Right     Comment:  Procedure: CYSTOSCOPY/URETEROSCOPY/HOLMIUM LASER/STENT               EXCHANGE;  Surgeon: Penne Knee, MD;  Location: [facility]               ORS;  Service: Urology;  Laterality: Right;  antegrade vs              retrograde 03/17/2018: IR CONVERT RIGHT NEPHROSTOMY TO NEPHROURETERAL CATH 02/11/2018: IR NEPHROSTOMY PLACEMENT RIGHT 04/02/2023: LEFT HEART CATH AND CORS/GRAFTS ANGIOGRAPHY; N/A     Comment:  Procedure: LEFT HEART CATH AND CORS/GRAFTS ANGIOGRAPHY;               Surgeon: Florencio Cara BIRCH, MD;  Location: [facility] INVASIVE              CV LAB;  Service: Cardiovascular;  Laterality: N/A; BMI    Body Mass Index: 31.84 kg/m     Reproductive/Obstetrics negative OB ROS  Anesthesia Physical Anesthesia Plan  ASA: 3  Anesthesia Plan: General   Post-op Pain Management:    Induction:   PONV Risk Score and Plan:   Airway Management Planned:   Additional Equipment:   Intra-op Plan:   Post-operative Plan:   Informed Consent: I have reviewed the patients History and Physical, chart, labs and discussed the procedure including the risks, benefits and alternatives for the proposed anesthesia with the patient or authorized representative who has indicated his/her understanding and acceptance.     Dental Advisory Given  Plan Discussed with: CRNA  Anesthesia Plan Comments:         Anesthesia Quick Evaluation

## 2024-05-10 NOTE — Interval H&P Note (Signed)
 History and Physical Interval Note:  05/10/2024 10:01 AM  Robert Fields  has presented today for surgery, with the diagnosis of HX OF ADENOMATOUS POLYP OF COLON.  The various methods of treatment have been discussed with the patient and family. After consideration of risks, benefits and other options for treatment, the patient has consented to  Procedure(s): COLONOSCOPY (N/A) as a surgical intervention.  The patient's history has been reviewed, patient examined, no change in status, stable for surgery.  I have reviewed the patient's chart and labs.  Questions were answered to the patient's satisfaction.     Ole ONEIDA Schick  Ok to proceed with colonoscopy

## 2024-05-10 NOTE — Op Note (Signed)
 [facility] Patient Name: Robert Fields Procedure Date: 05/10/2024 9:56 AM MRN: [id number] Account #: [id number] Date of Birth: 19-Jun-1951 Admit Type: Outpatient Age: 73 Room: [facility] ENDO ROOM 1 Gender: Male Note Status: Finalized Instrument Name: Colon Scope [id number] Procedure:             Colonoscopy Indications:           Surveillance: Personal history of adenomatous polyps                         on last colonoscopy 3 years ago Providers:             Ole Schick MD, MD Referring MD:          Ophelia Sage, MD (Referring MD) Medicines:             Monitored Anesthesia Care Complications:         No immediate complications. Estimated blood loss:                         Minimal. Procedure:             Pre-Anesthesia Assessment:                        - Prior to the procedure, a History and Physical was                         performed, and patient medications and allergies were                         reviewed. The patient is competent. The risks and                         benefits of the procedure and the sedation options and                         risks were discussed with the patient. All questions                         were answered and informed consent was obtained.                         Patient identification and proposed procedure were                         verified by the physician, the nurse, the                         anesthesiologist, the anesthetist and the technician                         in the endoscopy suite. Mental Status Examination:                         alert and oriented. Airway Examination: normal                         oropharyngeal airway and neck mobility. Respiratory  Examination: clear to auscultation. CV Examination:                         normal. Prophylactic Antibiotics: The patient does not                         require prophylactic antibiotics. Prior                          Anticoagulants: The patient has taken no anticoagulant                         or antiplatelet agents. ASA Grade Assessment: III - A                         patient with severe systemic disease. After reviewing                         the risks and benefits, the patient was deemed in                         satisfactory condition to undergo the procedure. The                         anesthesia plan was to use monitored anesthesia care                         (MAC). Immediately prior to administration of                         medications, the patient was re-assessed for adequacy                         to receive sedatives. The heart rate, respiratory                         rate, oxygen saturations, blood pressure, adequacy of                         pulmonary ventilation, and response to care were                         monitored throughout the procedure. The physical                         status of the patient was re-assessed after the                         procedure.                        After obtaining informed consent, the colonoscope was                         passed under direct vision. Throughout the procedure,                         the patient's blood pressure, pulse, and oxygen  saturations were monitored continuously. The                         Colonoscope was introduced through the anus and                         advanced to the the terminal ileum, with                         identification of the appendiceal orifice and IC                         valve. The colonoscopy was performed without                         difficulty. The patient tolerated the procedure well.                         The quality of the bowel preparation was good. The                         terminal ileum, ileocecal valve, appendiceal orifice,                         and rectum were photographed. Findings:      The perianal and digital rectal examinations were  normal.      The terminal ileum appeared normal.      A 7 mm polyp was found in the ileocecal valve. The polyp was sessile.       The polyp was removed with a cold snare. Resection and retrieval were       complete. Estimated blood loss was minimal.      A 4 mm polyp was found in the transverse colon. The polyp was sessile.       The polyp was removed with a cold snare. Resection and retrieval were       complete. Estimated blood loss was minimal.      A 2 mm polyp was found in the descending colon. The polyp was sessile.       The polyp was removed with a cold snare. Resection and retrieval were       complete. Estimated blood loss was minimal.      A few small-mouthed diverticula were found in the sigmoid colon and       descending colon.      A tattoo was seen in the sigmoid colon. A post-polypectomy scar was       found at the tattoo site.      A 4 mm polyp was found in the rectum. The polyp was sessile. The polyp       was removed with a cold snare. Resection and retrieval were complete.       Estimated blood loss was minimal.      Internal hemorrhoids were found during retroflexion. The hemorrhoids       were Grade I (internal hemorrhoids that do not prolapse).      The exam was otherwise without abnormality on direct and retroflexion       views. Impression:            - The examined portion of the ileum was normal.                        -  One 7 mm polyp at the ileocecal valve, removed with                         a cold snare. Resected and retrieved.                        - One 4 mm polyp in the transverse colon, removed with                         a cold snare. Resected and retrieved.                        - One 2 mm polyp in the descending colon, removed with                         a cold snare. Resected and retrieved.                        - Diverticulosis in the sigmoid colon and in the                         descending colon.                        - A tattoo was  seen in the sigmoid colon. A                         post-polypectomy scar was found at the tattoo site.                        - One 4 mm polyp in the rectum, removed with a cold                         snare. Resected and retrieved.                        - Internal hemorrhoids.                        - The examination was otherwise normal on direct and                         retroflexion views. Recommendation:        - Discharge patient to home.                        - Resume previous diet.                        - Continue present medications.                        - Await pathology results.                        - Repeat colonoscopy in 3 - 5 years for surveillance.                        - Return to referring physician as previously  scheduled. Procedure Code(s):     --- Professional ---                        [phone], Colonoscopy, flexible; with removal of                         tumor(s), polyp(s), or other lesion(s) by snare                         technique Diagnosis Code(s):     --- Professional ---                        Z86.010, Personal history of colonic polyps                        K64.0, First degree hemorrhoids                        D12.0, Benign neoplasm of cecum                        D12.3, Benign neoplasm of transverse colon (hepatic                         flexure or splenic flexure)                        D12.4, Benign neoplasm of descending colon                        D12.8, Benign neoplasm of rectum                        K57.30, Diverticulosis of large intestine without                         perforation or abscess without bleeding CPT copyright 2022 [employer]. All rights reserved. The codes documented in this report are preliminary and upon coder review may  be revised to meet current compliance requirements. Ole Schick MD, MD 05/10/2024 10:34:04 AM Number of Addenda: 0 Note Initiated On: 05/10/2024  9:56 AM Scope Withdrawal Time: 0 hours 9 minutes 27 seconds  Total Procedure Duration: 0 hours 16 minutes 4 seconds  Estimated Blood Loss:  Estimated blood loss was minimal.      [facility]

## 2024-05-10 NOTE — Transfer of Care (Signed)
 Immediate Anesthesia Transfer of Care Note  Patient: Robert Fields  Procedure(s) Performed: COLONOSCOPY POLYPECTOMY, INTESTINE  Patient Location: Endoscopy Unit  Anesthesia Type:General  Level of Consciousness: sedated and drowsy  Airway & Oxygen Therapy: Patient Spontanous Breathing  Post-op Assessment: Report given to RN and Post -op Vital signs reviewed and stable  Post vital signs: Reviewed and stable  Last Vitals:  Vitals Value Taken Time  BP 103/75 05/10/24 10:28  Temp 35.7 C 05/10/24 10:28  Pulse 86 05/10/24 10:28  Resp 15 05/10/24 10:28  SpO2 98 % 05/10/24 10:28    Last Pain:  Vitals:   05/10/24 1028  TempSrc: Tympanic  PainSc: Asleep         Complications: No notable events documented.

## 2024-05-11 LAB — SURGICAL PATHOLOGY
